# Patient Record
Sex: Female | Born: 1995
Health system: Southern US, Community
[De-identification: ages and names within clinical notes are randomized; demographics above are authoritative.]

## PROBLEM LIST (undated history)

## (undated) DIAGNOSIS — D649 Anemia, unspecified: Secondary | ICD-10-CM

## (undated) DIAGNOSIS — I10 Essential (primary) hypertension: Secondary | ICD-10-CM

## (undated) DIAGNOSIS — L0291 Cutaneous abscess, unspecified: Secondary | ICD-10-CM

## (undated) DIAGNOSIS — O139 Gestational [pregnancy-induced] hypertension without significant proteinuria, unspecified trimester: Secondary | ICD-10-CM

## (undated) DIAGNOSIS — B009 Herpesviral infection, unspecified: Secondary | ICD-10-CM

## (undated) DIAGNOSIS — M419 Scoliosis, unspecified: Secondary | ICD-10-CM

## (undated) HISTORY — DX: Scoliosis, unspecified: M41.9

## (undated) HISTORY — DX: Essential (primary) hypertension: I10

## (undated) HISTORY — DX: Herpesviral infection, unspecified: B00.9

## (undated) HISTORY — DX: Anemia, unspecified: D64.9

## (undated) HISTORY — PX: WISDOM TOOTH EXTRACTION: SHX21

---
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
## 2013-01-29 ENCOUNTER — Emergency Department (HOSPITAL_COMMUNITY)
Admission: EM | Admit: 2013-01-29 | Discharge: 2013-01-29 | Disposition: A | Discharge status: Home / Self Care | Payer: Medicaid Other | Attending: Emergency Medicine | Admitting: Emergency Medicine

## 2013-01-29 ENCOUNTER — Encounter (HOSPITAL_COMMUNITY): Payer: Self-pay | Admitting: Emergency Medicine

## 2013-01-29 DIAGNOSIS — R109 Unspecified abdominal pain: Secondary | ICD-10-CM | POA: Insufficient documentation

## 2013-01-29 DIAGNOSIS — R82998 Other abnormal findings in urine: Secondary | ICD-10-CM | POA: Insufficient documentation

## 2013-01-29 DIAGNOSIS — Z3202 Encounter for pregnancy test, result negative: Secondary | ICD-10-CM | POA: Insufficient documentation

## 2013-01-29 DIAGNOSIS — R3989 Other symptoms and signs involving the genitourinary system: Secondary | ICD-10-CM

## 2013-01-29 DIAGNOSIS — G8929 Other chronic pain: Secondary | ICD-10-CM | POA: Insufficient documentation

## 2013-01-29 LAB — URINALYSIS, ROUTINE W REFLEX MICROSCOPIC
Nitrite: NEGATIVE
Protein, ur: NEGATIVE mg/dL
Specific Gravity, Urine: 1.004 — ABNORMAL LOW (ref 1.005–1.030)
Urobilinogen, UA: 0.2 mg/dL (ref 0.0–1.0)

## 2013-01-29 LAB — POCT PREGNANCY, URINE: Preg Test, Ur: NEGATIVE

## 2013-01-29 LAB — URINE MICROSCOPIC-ADD ON

## 2013-01-29 NOTE — ED Provider Notes (Signed)
History     CSN: [id number]  Arrival date & time 01/29/13  2023   First MD Initiated Contact with Patient 01/29/13 2035      Chief Complaint  Patient presents with  . c/o black urine     (Consider location/radiation/quality/duration/timing/severity/associated sxs/prior treatment) The history is provided by the patient.   17 year old female urinated tonight noted that the urine was black. She states that she had just died her hair. When she gave a urine sample after arriving in the ED, and the urine was almost completely clear. She denies urinary urgency, frequency, tenesmus, or dysuria. She has chronic suprapubic pain which is unchanged. She denies any flank pain. She denies any nausea or vomiting. Denies fever or chills.  History reviewed. No pertinent past medical history.  History reviewed. No pertinent past surgical history.  History reviewed. No pertinent family history.  History  Substance Use Topics  . Smoking status: Never Smoker   . Smokeless tobacco: Not on file  . Alcohol Use: Yes    OB History   Grav Para Term Preterm Abortions TAB SAB Ect Mult Living                  Review of Systems  All other systems reviewed and are negative.    Allergies  Review of patient's allergies indicates no known allergies.  Home Medications  No current outpatient prescriptions on file.  BP 138/91  Pulse 83  Temp(Src) 98.5 F (36.9 C) (Oral)  Resp 18  SpO2 100%  LMP 12/27/2012  Physical Exam  Nursing note and vitals reviewed.  17 year old female, resting comfortably and in no acute distress. Vital signs are significant for borderline hypertension with blood pressure 138/91. Oxygen saturation is 100%, which is normal. Head is normocephalic and atraumatic. PERRLA, EOMI. Oropharynx is clear. Neck is nontender and supple without adenopathy or JVD. Back is nontender and there is no CVA tenderness. Lungs are clear without rales, wheezes, or rhonchi. Chest is  nontender. Heart has regular rate and rhythm without murmur. Abdomen is soft, flat, nontender without masses or hepatosplenomegaly and peristalsis is normoactive. Extremities have no cyanosis or edema, full range of motion is present. Skin is warm and dry without rash. Neurologic: Mental status is normal, cranial nerves are intact, there are no motor or sensory deficits.  ED Course  Procedures (including critical care time)  Results for orders placed during the hospital encounter of 01/29/13  URINALYSIS, ROUTINE W REFLEX MICROSCOPIC      Result Value Range   Color, Urine BROWN (*) YELLOW   APPearance CLEAR  CLEAR   Specific Gravity, Urine 1.004 (*) 1.005 - 1.030   pH 7.5  5.0 - 8.0   Glucose, UA NEGATIVE  NEGATIVE mg/dL   Hgb urine dipstick NEGATIVE  NEGATIVE   Bilirubin Urine NEGATIVE  NEGATIVE   Ketones, ur NEGATIVE  NEGATIVE mg/dL   Protein, ur NEGATIVE  NEGATIVE mg/dL   Urobilinogen, UA 0.2  0.0 - 1.0 mg/dL   Nitrite NEGATIVE  NEGATIVE   Leukocytes, UA TRACE (*) NEGATIVE  URINE MICROSCOPIC-ADD ON      Result Value Range   Squamous Epithelial / LPF FEW (*) RARE   WBC, UA 0-2  <3 WBC/hpf   RBC / HPF 0-2  <3 RBC/hpf   Bacteria, UA RARE  RARE  POCT PREGNANCY, URINE      Result Value Range   Preg Test, Ur NEGATIVE  NEGATIVE     1. Abnormal urine color  MDM  Discolored urine of uncertain cause. Urinalysis is been sent. I doubt that the discolored urine is related to her hair dyes since that would not be absorbed into the body to be excreted by the urine.  Urinalysis does show urine is a brown color. Etiology of this is unclear. However, there is no indication for further testing at this time. She is discharged with reassurance but told to return if new symptoms appear.  Dione Booze, MD 01/29/13 2251

## 2013-01-29 NOTE — ED Notes (Signed)
Pt states she dyed her hair black today. Pt states she went to the bathroom and noticed that her urine was black/brown in color. Pt denies pain upon urination. Pt denies n/v/d. Pt c/o abdominal pain that she states is constant and chronic. Pt denies taking medications other than birth control.

## 2013-01-29 NOTE — ED Notes (Signed)
Pt alert and mentating appropriately upon d/c. Pt given d/c teaching and follow up care instructions. NAD noted upon d/c. Pt verbalizes understanding and has no further questions upon d/c. Pt ambulatory upon d/c.

## 2013-04-21 ENCOUNTER — Emergency Department (INDEPENDENT_AMBULATORY_CARE_PROVIDER_SITE_OTHER)
Admission: EM | Admit: 2013-04-21 | Discharge: 2013-04-21 | Disposition: A | Discharge status: Home / Self Care | Payer: Medicaid Other | Source: Home / Self Care

## 2013-04-21 ENCOUNTER — Encounter (HOSPITAL_COMMUNITY): Payer: Self-pay

## 2013-04-21 DIAGNOSIS — L0390 Cellulitis, unspecified: Secondary | ICD-10-CM

## 2013-04-21 DIAGNOSIS — L0291 Cutaneous abscess, unspecified: Secondary | ICD-10-CM

## 2013-04-21 NOTE — ED Provider Notes (Signed)
Robyn Miller is a 17 y.o. female who presents to Urgent Care today for abscess right buttocks. Patient notes a painful abscess of her right buttocks just inferior to her vagina worsening yesterday and today. She tried to use an antibiotic on it which did not help. She denies any previous history of abscess and feels well otherwise. No fevers or chills. No vaginal discharge. She notes that she shaves this area, and thinks it may started from an ingrown hair. Feels well otherwise.    PMH reviewed. Healthy History  Substance Use Topics  . Smoking status: Never Smoker   . Smokeless tobacco: Not on file  . Alcohol Use: Yes   ROS as above Medications reviewed. No current facility-administered medications for this encounter.   No current outpatient prescriptions on file.    Exam:  BP 144/76  Pulse 75  Temp(Src) 97.6 F (36.4 C) (Oral)  Resp 16  SpO2 100% Gen: Well NAD Skin: Erythematous swelling of the right buttocks just inferior to the anus. Area of fluctuance palpated surrounded by a small area of induration. Tender to palpation.   Abscess I&D:  Consent obtained and timeout performed.  Skin overlying the abscess was cleaned with alcohol and 2 mL of 2% lidocaine with epinephrine were injected overlying the area of fluctuance.  A scalpel was used to cut down to the abscess and pus was expressed.  The wound was then turned into a triangle and a small section of skin removed.  The wound is then explored with a wooden Q-tip breaking up loculations, and further pus expressed.  The wound was then packed with about 2 inches of quarter inch packing material and a dressing was applied.   Assessment and Plan: 17 y.o. female with abscess likely from ingrown hair due to shaving the pubic area.  Abscess incised and drained, and packed.  Remove packing material in 2 or 3 days.  Followup as needed.     Rodolph Bong, MD 04/21/13 [phone]

## 2013-04-21 NOTE — ED Notes (Signed)
Patient c/o abscess near vaginal area

## 2013-05-18 ENCOUNTER — Emergency Department (INDEPENDENT_AMBULATORY_CARE_PROVIDER_SITE_OTHER)
Admission: EM | Admit: 2013-05-18 | Discharge: 2013-05-18 | Disposition: A | Discharge status: Home / Self Care | Payer: Medicaid Other | Source: Home / Self Care | Attending: Family Medicine | Admitting: Family Medicine

## 2013-05-18 ENCOUNTER — Encounter (HOSPITAL_COMMUNITY): Payer: Self-pay | Admitting: *Deleted

## 2013-05-18 DIAGNOSIS — L03317 Cellulitis of buttock: Secondary | ICD-10-CM

## 2013-05-18 HISTORY — DX: Cutaneous abscess, unspecified: L02.91

## 2013-05-18 MED ORDER — DOXYCYCLINE HYCLATE 100 MG PO CAPS: 100.0000 mg | ORAL_CAPSULE | Freq: Two times a day (BID) | ORAL | Status: DC

## 2013-05-18 NOTE — ED Notes (Signed)
Had I&D buttock abscess 7/20; states completely went away, but started to notice small bump to same area again last night.  No fevers or drainage.

## 2013-05-18 NOTE — ED Provider Notes (Signed)
  CSN: [id number]     Arrival date & time 05/18/13  0865 History     First MD Initiated Contact with Patient 05/18/13 1009     Chief Complaint  Patient presents with  . Abscess   (Consider location/radiation/quality/duration/timing/severity/associated sxs/prior Treatment) Patient is a 17 y.o. female presenting with abscess. The history is provided by the patient.  Abscess Location:  Ano-genital Ano-genital abscess location:  Perineum Abscess quality: fluctuance, induration, painful and redness   Abscess quality: not draining   Red streaking: no   Duration:  1 day Pain details:    Severity:  Mild   Progression:  Worsening Chronicity:  Recurrent Context comment:  Similar lesion resolved on 7/20   Past Medical History  Diagnosis Date  . Abscess    History reviewed. No pertinent past surgical history. No family history on file. History  Substance Use Topics  . Smoking status: Never Smoker   . Smokeless tobacco: Not on file  . Alcohol Use: Yes     Comment: occasional   OB History   Grav Para Term Preterm Abortions TAB SAB Ect Mult Living                 Review of Systems  Constitutional: Negative.   Genitourinary: Negative.   Skin: Positive for rash.    Allergies  Review of patient's allergies indicates no known allergies.  Home Medications   Current Outpatient Rx  Name  Route  Sig  Dispense  Refill  . doxycycline (VIBRAMYCIN) 100 MG capsule   Oral   Take 1 capsule (100 mg total) by mouth 2 (two) times daily.   20 capsule   0    BP 137/89  Pulse 72  Temp(Src) 98.1 F (36.7 C) (Oral)  Resp 16  SpO2 100%  LMP 04/21/2013 Physical Exam  Nursing note and vitals reviewed. Constitutional: She appears well-developed and well-nourished.  Skin: Skin is warm and dry. Rash noted. There is erythema.  Indurated area possible sl fluctuance to right perineum. Pt desires abx.    ED Course   Procedures (including critical care time)  Labs Reviewed - No data to  display No results found. 1. Cellulitis and abscess of buttock     MDM    Linna Hoff, MD 05/18/13 1025

## 2013-05-28 ENCOUNTER — Other Ambulatory Visit (HOSPITAL_COMMUNITY)
Admission: RE | Admit: 2013-05-28 | Discharge: 2013-05-28 | Disposition: A | Discharge status: Home / Self Care | Payer: Medicaid Other | Source: Ambulatory Visit | Attending: Family Medicine | Admitting: Family Medicine

## 2013-05-28 ENCOUNTER — Emergency Department (INDEPENDENT_AMBULATORY_CARE_PROVIDER_SITE_OTHER)
Admission: EM | Admit: 2013-05-28 | Discharge: 2013-05-28 | Disposition: A | Discharge status: Home / Self Care | Payer: Medicaid Other | Source: Home / Self Care

## 2013-05-28 ENCOUNTER — Encounter (HOSPITAL_COMMUNITY): Payer: Self-pay | Admitting: Emergency Medicine

## 2013-05-28 DIAGNOSIS — N760 Acute vaginitis: Secondary | ICD-10-CM | POA: Insufficient documentation

## 2013-05-28 DIAGNOSIS — Z113 Encounter for screening for infections with a predominantly sexual mode of transmission: Secondary | ICD-10-CM | POA: Insufficient documentation

## 2013-05-28 DIAGNOSIS — N898 Other specified noninflammatory disorders of vagina: Secondary | ICD-10-CM

## 2013-05-28 LAB — POCT URINALYSIS DIP (DEVICE)
Hgb urine dipstick: NEGATIVE
Ketones, ur: NEGATIVE mg/dL
Protein, ur: NEGATIVE mg/dL
Specific Gravity, Urine: 1.02 (ref 1.005–1.030)
Urobilinogen, UA: 0.2 mg/dL (ref 0.0–1.0)

## 2013-05-28 MED ORDER — FLUCONAZOLE 150 MG PO TABS: ORAL_TABLET | ORAL | Status: DC

## 2013-05-28 MED ORDER — METRONIDAZOLE 500 MG PO TABS: 500.0000 mg | ORAL_TABLET | Freq: Two times a day (BID) | ORAL | Status: DC

## 2013-05-28 NOTE — Discharge Instructions (Signed)
Vaginitis Vaginitis is an inflammation of the vagina. It is most often caused by a change in the normal balance of the bacteria and yeast that live in the vagina. This change in balance causes an overgrowth of certain bacteria or yeast, which causes the inflammation. There are different types of vaginitis, but the most common types are:  Bacterial vaginosis.  Yeast infection (candidiasis).  Trichomoniasis vaginitis. This is a sexually transmitted infection (STI).  Viral vaginitis.  Atropic vaginitis.  Allergic vaginitis. CAUSES  The cause depends on the type of vaginitis. Vaginitis can be caused by:  Bacteria (bacterial vaginosis).  Yeast (yeast infection).  A parasite (trichomoniasis vaginitis)  A virus (viral vaginitis).  Low hormone levels (atrophic vaginitis). Low hormone levels can occur during pregnancy, breastfeeding, or after menopause.  Irritants, such as bubble baths, scented tampons, and feminine sprays (allergic vaginitis). Other factors can change the normal balance of the yeast and bacteria that live in the vagina. These include:  Antibiotic medicines.  Poor hygiene.  Diaphragms, vaginal sponges, spermicides, birth control pills, and intrauterine devices (IUD).  Sexual intercourse.  Infection.  Uncontrolled diabetes.  A weakened immune system. SYMPTOMS  Symptoms can vary depending on the cause of the vaginitis. Common symptoms include:  Abnormal vaginal discharge.  The discharge is white, gray, or yellow with bacterial vaginosis.  The discharge is thick, white, and cheesy with a yeast infection.  The discharge is frothy and yellow or greenish with trichomoniasis.  A bad vaginal odor.  The odor is fishy with bacterial vaginosis.  Vaginal itching, pain, or swelling.  Painful intercourse.  Pain or burning when urinating. Sometimes, there are no symptoms. TREATMENT  Treatment will vary depending on the type of infection.   Bacterial  vaginosis and trichomoniasis are often treated with antibiotic creams or pills.  Yeast infections are often treated with antifungal medicines, such as vaginal creams or suppositories.  Viral vaginitis has no cure, but symptoms can be treated with medicines that relieve discomfort. Your sexual partner should be treated as well.  Atrophic vaginitis may be treated with an estrogen cream, pill, suppository, or vaginal ring. If vaginal dryness occurs, lubricants and moisturizing creams may help. You may be told to avoid scented soaps, sprays, or douches.  Allergic vaginitis treatment involves quitting the use of the product that is causing the problem. Vaginal creams can be used to treat the symptoms. HOME CARE INSTRUCTIONS   Take all medicines as directed by your caregiver.  Keep your genital area clean and dry. Avoid soap and only rinse the area with water.  Avoid douching. It can remove the healthy bacteria in the vagina.  Do not use tampons or have sexual intercourse until your vaginitis has been treated. Use sanitary pads while you have vaginitis.  Wipe from front to back. This avoids the spread of bacteria from the rectum to the vagina.  Let air reach your genital area.  Wear cotton underwear to decrease moisture buildup.  Avoid wearing underwear while you sleep until your vaginitis is gone.  Avoid tight pants and underwear or nylons without a cotton panel.  Take off wet clothing (especially bathing suits) as soon as possible.  Use mild, non-scented products. Avoid using irritants, such as:  Scented feminine sprays.  Fabric softeners.  Scented detergents.  Scented tampons.  Scented soaps or bubble baths.  Practice safe sex and use condoms. Condoms may prevent the spread of trichomoniasis and viral vaginitis. SEEK MEDICAL CARE IF:   You have abdominal pain.  You  have a fever or persistent symptoms for more than 2 3 days.  You have a fever and your symptoms suddenly  get worse. Document Released: 07/17/2007 Document Revised: 06/13/2012 Document Reviewed: 03/01/2012 [facility] Patient Information 2014 [place], [place].  General Instructions for Vaginal Infections Vaginitis is a term to describe many common vaginal infections. These infections may be due to an imbalance of normal germs (bacteria) that exist in the vagina. Many others are caused by sexually transmitted diseases (STDs). If any medicine was prescribed to treat your specific infection, it is very important that you take the medicine as directed. Your caregiver may want to examine and treat your sex partner. CAUSES  The vagina normally contains organisms (bacteria and yeast) in a balance. Certain factors can disturb this balance and cause an infection, such as:  Sexual intercourse.  Nursing.  Pregnancy.  Menopause.  Hormone changes in the body.  Antibiotic medicines.  Infection elsewhere in your body.  Birth control pills or patches.  Douches.  Spermicides.  Medical illnesses, such as diabetes. SYMPTOMS  Different types of vaginal infections cause symptoms such as:  Itching.  Pain or burning.  Bad odor.  Pain or bleeding with sexual intercourse.  Redness of the vulva.  Abnormal discharge (yellow, green, heavy white and thick).  Fever.  A sore on the vulva or vagina.  Urinary symptoms (painful or bloody urine).  Pelvic or abdominal pain.  Rectal bleeding, discharge, or pain. DIAGNOSIS   Your caregiver will base the diagnosis upon the symptoms that you report.  A complete history of your sex life may be taken.  You may have a pelvic exam.  A sample of your vaginal fluid or discharge will be examined under the microscope.  Cultures will help complete the exact diagnosis. TREATMENT  Treatment depends on the cause of your vaginitis. Your treatment may include taking antibiotics. The antibiotic may be a shot, a pill, or vaginal suppository or cream. It is not  uncommon for more than one type of infection to be present. If more than one infection is present, two or more medicines may be required. Reoccurrence of vaginal infections may be treated with vaginal suppositories or a vaginal cream 2 times a week, or as directed. If your caregiver finds that an STD exists, treatment of your sexual partner(s) is important. This is especially important for those infected with chlamydia, gonorrhea, trichomoniasis, bacterial vaginosis, syphilis, and HIV infections. Treating sexual partners will prevent you from being re-infected and will help stop the spread of STD infection to others. Although it is best to see a specialist for STD/HIV testing and counseling, this is not always possible. Some states/provinces permit something called "expedited partner therapy."This kind of program permits you to deliver prescription(s) to a partner without the partner having to seek a formal medical exam.  HOME CARE INSTRUCTIONS   Take all prescribed medicine.  If applicable, speak to your partner about recommended treatment.  Do not have sexual intercourse for 1 week, or as directed by your caregiver.  Practice safe sex.  Use condoms.  Have only 1 sex partner.  Make sure your sex partner does not have any other sex partners.  Avoid tight pants and panty hose.  Wear cotton underwear.  Do not douche.  Avoid tampons, especially scented ones.  Take warm sitz baths.  Avoid vaginal sprays, perfumed soaps, and bath oils.  Apply medicated cream (steroid cream) for itching or irritation with the permission of your caregiver. SEEK MEDICAL CARE IF:   You  have any kind of abnormal vaginal discharge.  Your sex partner has a genital infection.  You have pain or bleeding with sexual intercourse.  You have itching, pain, irritation or bleeding of the vulva. SEEK IMMEDIATE MEDICAL CARE IF:   You have an oral temperature above 102 F (38.9 C), not controlled by  medicine.  You have abdominal pain.  Your symptoms do not improve within 3 days or as directed.  You have painful or bloody urine.  You have rectal pain, bleeding, or discharge. Document Released: 06/29/2005 Document Revised: 12/12/2011 Document Reviewed: 02/12/2009 [facility] Patient Information 2013 [place], [place].  Bacterial Vaginosis Bacterial vaginosis (BV) is a vaginal infection where the normal balance of bacteria in the vagina is disrupted. The normal balance is then replaced by an overgrowth of certain bacteria. There are several different kinds of bacteria that can cause BV. BV is the most common vaginal infection in women of childbearing age. CAUSES   The cause of BV is not fully understood. BV develops when there is an increase or imbalance of harmful bacteria.  Some activities or behaviors can upset the normal balance of bacteria in the vagina and put women at increased risk including:  Having a new sex partner or multiple sex partners.  Douching.  Using an intrauterine device (IUD) for contraception.  It is not clear what role sexual activity plays in the development of BV. However, women that have never had sexual intercourse are rarely infected with BV. Women do not get BV from toilet seats, bedding, swimming pools or from touching objects around them.  SYMPTOMS   Grey vaginal discharge.  A fish-like odor with discharge, especially after sexual intercourse.  Itching or burning of the vagina and vulva.  Burning or pain with urination.  Some women have no signs or symptoms at all. DIAGNOSIS  Your caregiver must examine the vagina for signs of BV. Your caregiver will perform lab tests and look at the sample of vaginal fluid through a microscope. They will look for bacteria and abnormal cells (clue cells), a pH test higher than 4.5, and a positive amine test all associated with BV.  RISKS AND COMPLICATIONS   Pelvic inflammatory disease (PID).  Infections  following gynecology surgery.  Developing HIV.  Developing herpes virus. TREATMENT  Sometimes BV will clear up without treatment. However, all women with symptoms of BV should be treated to avoid complications, especially if gynecology surgery is planned. Female partners generally do not need to be treated. However, BV may spread between female sex partners so treatment is helpful in preventing a recurrence of BV.   BV may be treated with antibiotics. The antibiotics come in either pill or vaginal cream forms. Either can be used with nonpregnant or pregnant women, but the recommended dosages differ. These antibiotics are not harmful to the baby.  BV can recur after treatment. If this happens, a second round of antibiotics will often be prescribed.  Treatment is important for pregnant women. If not treated, BV can cause a premature delivery, especially for a pregnant woman who had a premature birth in the past. All pregnant women who have symptoms of BV should be checked and treated.  For chronic reoccurrence of BV, treatment with a type of prescribed gel vaginally twice a week is helpful. HOME CARE INSTRUCTIONS   Finish all medication as directed by your caregiver.  Do not have sex until treatment is completed.  Tell your sexual partner that you have a vaginal infection. They should see their  caregiver and be treated if they have problems, such as a mild rash or itching.  Practice safe sex. Use condoms. Only have 1 sex partner. PREVENTION  Basic prevention steps can help reduce the risk of upsetting the natural balance of bacteria in the vagina and developing BV:  Do not have sexual intercourse (be abstinent).  Do not douche.  Use all of the medicine prescribed for treatment of BV, even if the signs and symptoms go away.  Tell your sex partner if you have BV. That way, they can be treated, if needed, to prevent reoccurrence. SEEK MEDICAL CARE IF:   Your symptoms are not improving  after 3 days of treatment.  You have increased discharge, pain, or fever. MAKE SURE YOU:   Understand these instructions.  Will watch your condition.  Will get help right away if you are not doing well or get worse. FOR MORE INFORMATION  Division of STD Prevention (DSTDP), Centers for Disease Control and Prevention: [employer] American Social Health Association (ASHA): www.ashastd.org  Document Released: 09/19/2005 Document Revised: 12/12/2011 Document Reviewed: 03/12/2009 [facility] Patient Information 2014 [place], [place].

## 2013-05-28 NOTE — ED Provider Notes (Signed)
CSN: [id number]     Arrival date & time 05/28/13  1119 History   None    Chief Complaint  Patient presents with  . Rash   (Consider location/radiation/quality/duration/timing/severity/associated sxs/prior Treatment) HPI Comments: 17 year old female states that she had a boil in her buttocks apparently one month ago. She was treated with I&D and this abated. She later developed another smaller abscess that was treated with doxycycline and has since abated. The doxycycline his been making her feel bad and has precipitated 2 episodes of Candida vaginitis. She presents with a four-day history of vaginal discharge associated with a red base, tenderness and burning in the vulvovaginal tissues. There some improvement in the burning however she still has itching in a vaginal discharge. LMP was at the and July. Pregnancy test is negative. Denies pelvic pain or abdominal pain.   Past Medical History  Diagnosis Date  . Abscess    History reviewed. No pertinent past surgical history. History reviewed. No pertinent family history. History  Substance Use Topics  . Smoking status: Never Smoker   . Smokeless tobacco: Not on file  . Alcohol Use: Yes     Comment: occasional   OB History   Grav Para Term Preterm Abortions TAB SAB Ect Mult Living                 Review of Systems  Constitutional: Negative.   Respiratory: Negative.   Gastrointestinal: Negative.   Genitourinary: Positive for vaginal discharge. Negative for dysuria, urgency, frequency, flank pain, decreased urine volume, vaginal bleeding, genital sores, vaginal pain, menstrual problem and pelvic pain.  Skin: Negative.   Neurological: Negative.     Allergies  Review of patient's allergies indicates no known allergies.  Home Medications   Current Outpatient Rx  Name  Route  Sig  Dispense  Refill  . fluconazole (DIFLUCAN) 150 MG tablet      1 tab po x 1. May repeat in 72 hours if no improvement   2 tablet   0   .  metroNIDAZOLE (FLAGYL) 500 MG tablet   Oral   Take 1 tablet (500 mg total) by mouth 2 (two) times daily. X 7 days   14 tablet   0    BP 119/80  Temp(Src) 98.7 F (37.1 C) (Oral)  Resp 16  SpO2 100%  LMP 04/26/2013 Physical Exam  Nursing note and vitals reviewed. Constitutional: She is oriented to person, place, and time. She appears well-developed and well-nourished. She appears distressed.  Neck: Normal range of motion. Neck supple.  Cardiovascular: Normal rate.   Pulmonary/Chest: Effort normal. No respiratory distress.  Abdominal: Soft. She exhibits no distension and no mass. There is no tenderness. There is no rebound and no guarding.  Genitourinary: Vaginal discharge found.  Normal external female genitalia Minor erythema and scant dried discharge to be in any minora and posterior fornix. Vagina with scant vaginal discharge coating the walls and a small amount surrounding the cervix. This is a thick or flight gray color. The cervix is anterior in midline. Os normal parous, the ectocervix has erythema and inflammation. Bimanual: No CMT or adnexal tenderness.  Musculoskeletal: Normal range of motion. She exhibits no edema.  Neurological: She is alert and oriented to person, place, and time. She exhibits normal muscle tone.  Skin: Skin is warm and dry. No rash noted. No erythema.  Psychiatric: She has a normal mood and affect.    ED Course  Procedures (including critical care time) Labs Review Labs Reviewed  POCT URINALYSIS DIP (DEVICE)  POCT PREGNANCY, URINE  CERVICOVAGINAL ANCILLARY ONLY   Imaging Review No results found.  MDM   1. Vaginitis   2. Vaginal discharge    Suspect the patient may have a combination of ED and Candida vaginitis. The ancillary swabs were obtained and results are pending. She is treated with Flagyl 500 mg twice a day for 7 days and Diflucan one tab now may repeat in 72 hours if needed.   Hayden Rasmussen, NP 05/28/13 1234

## 2013-05-28 NOTE — ED Notes (Signed)
C/O vaginal rash with itching and discharge. Pt states she was taking doxycycline for a boil and believes she has a yeast infection. Pt was given a prescription for a yeast infection as a precautionary with the doxycycline. Pt took the medication Saturday with no relief.

## 2013-05-30 NOTE — ED Provider Notes (Signed)
Medical screening examination/treatment/procedure(s) were performed by resident physician or non-physician practitioner and as supervising physician I was immediately available for consultation/collaboration.   Barkley Bruns MD.   Linna Hoff, MD 05/30/13 [phone]

## 2013-07-18 ENCOUNTER — Emergency Department (HOSPITAL_COMMUNITY)
Admission: EM | Admit: 2013-07-18 | Discharge: 2013-07-18 | Disposition: A | Discharge status: Home / Self Care | Payer: Medicaid Other | Attending: Emergency Medicine | Admitting: Emergency Medicine

## 2013-07-18 DIAGNOSIS — T550X1A Toxic effect of soaps, accidental (unintentional), initial encounter: Secondary | ICD-10-CM | POA: Insufficient documentation

## 2013-07-18 DIAGNOSIS — T7840XA Allergy, unspecified, initial encounter: Secondary | ICD-10-CM

## 2013-07-18 DIAGNOSIS — Z79899 Other long term (current) drug therapy: Secondary | ICD-10-CM | POA: Insufficient documentation

## 2013-07-18 DIAGNOSIS — L509 Urticaria, unspecified: Secondary | ICD-10-CM | POA: Insufficient documentation

## 2013-07-18 DIAGNOSIS — Z792 Long term (current) use of antibiotics: Secondary | ICD-10-CM | POA: Insufficient documentation

## 2013-07-18 DIAGNOSIS — Y939 Activity, unspecified: Secondary | ICD-10-CM | POA: Insufficient documentation

## 2013-07-18 DIAGNOSIS — T65891A Toxic effect of other specified substances, accidental (unintentional), initial encounter: Secondary | ICD-10-CM | POA: Insufficient documentation

## 2013-07-18 DIAGNOSIS — Y929 Unspecified place or not applicable: Secondary | ICD-10-CM | POA: Insufficient documentation

## 2013-07-18 MED ORDER — PREDNISONE 20 MG PO TABS: 40.0000 mg | ORAL_TABLET | Freq: Every day | ORAL | Status: DC

## 2013-07-18 MED ORDER — PREDNISONE 20 MG PO TABS
40.0000 mg | ORAL_TABLET | Freq: Once | ORAL | Status: AC
  Administered 2013-07-18: 40 mg via ORAL
  Filled 2013-07-18: qty 2

## 2013-07-18 NOTE — ED Provider Notes (Signed)
CSN: [id number]     Arrival date & time 07/18/13  2053 History   This chart was scribed for non-physician practitioner working with Vida Roller, MD by Clydene Laming, ED Scribe. This patient was seen in room TR11C/TR11C and the patient's care was started at 10:12 PM.    Chief Complaint  Patient presents with  . Allergic Reaction    The history is provided by the patient. No language interpreter was used.   HPI Comments: Robyn Miller is a 17 y.o. female who presents to the Emergency Department complaining of an allergic reaction to the arms, upper shoulders, and neck with an associated itch onset two weeks ago. Pt states reporting to the urgent care and was prescribed triamcinolone cream without relief. Pt reports she recently changed her laundry detergent to gain along with using a new baby oil. Pt denies being at risk of bed bugs.   Past Medical History  Diagnosis Date  . Abscess    No past surgical history on file. No family history on file. History  Substance Use Topics  . Smoking status: Never Smoker   . Smokeless tobacco: Not on file  . Alcohol Use: Yes     Comment: occasional   OB History   Grav Para Term Preterm Abortions TAB SAB Ect Mult Living                 Review of Systems  Skin: Positive for color change.  All other systems reviewed and are negative.    Allergies  Review of patient's allergies indicates no known allergies.  Home Medications   Current Outpatient Rx  Name  Route  Sig  Dispense  Refill  . amoxicillin (AMOXIL) 500 MG capsule   Oral   Take 500 mg by mouth 3 (three) times daily.         . hydrOXYzine (ATARAX/VISTARIL) 25 MG tablet   Oral   Take 25 mg by mouth every 8 (eight) hours as needed for itching.         . triamcinolone cream (KENALOG) 0.1 %   Topical   Apply topically 3 (three) times daily. X 5 days          Triage Vitals: BP 143/81  Pulse 82  Temp(Src) 98.2 F (36.8 C) (Oral)  Resp 18  SpO2 100% Physical Exam   Nursing note and vitals reviewed. Constitutional: She is oriented to person, place, and time. She appears well-developed and well-nourished. No distress.  HENT:  Head: Normocephalic and atraumatic.  Eyes: EOM are normal.  Neck: Neck supple. No tracheal deviation present.  Cardiovascular: Normal rate.   Pulmonary/Chest: Effort normal. No respiratory distress.  Musculoskeletal: Normal range of motion.  Neurological: She is alert and oriented to person, place, and time.  Skin: Skin is warm and dry.  Urticarial lesions on bilateral arms and upper back Pt scratching throughout interview   Psychiatric: She has a normal mood and affect. Her behavior is normal.    ED Course  Procedures (including critical care time) DIAGNOSTIC STUDIES: Oxygen Saturation is 100% on RA, normal by my interpretation.    COORDINATION OF CARE: 10:16 PM- Discussed treatment plan with pt at bedside. Pt verbalized understanding and agreement with plan.   Labs Review Labs Reviewed - No data to display Imaging Review No results found.  EKG Interpretation   None       MDM   1. Allergic reaction, initial encounter     11:41 PM Patient likely having allergic  reaction. I will treat her with Prednisone, as her symptoms have been persistent for the past 2 weeks. Vitals stable and patient afebrile.   I personally performed the services described in this documentation, which was scribed in my presence. The recorded information has been reviewed and is accurate.     Emilia Beck, PA-C 07/18/13 2342

## 2013-07-18 NOTE — ED Notes (Signed)
Skin irritation/allergic reaction for x 2 weeks. ucc prescribe hydroxyzine and triamcinolone ointment with no improvement. Pt. Changed laundry detergent to GAIN and baby oil.  Redness on arms, upper shoulders, and neck.

## 2013-07-19 NOTE — ED Provider Notes (Signed)
Medical screening examination/treatment/procedure(s) were performed by non-physician practitioner and as supervising physician I was immediately available for consultation/collaboration.    Vida Roller, MD 07/19/13 [phone]

## 2013-07-30 ENCOUNTER — Encounter (HOSPITAL_COMMUNITY): Payer: Self-pay | Admitting: Emergency Medicine

## 2013-07-30 ENCOUNTER — Emergency Department (INDEPENDENT_AMBULATORY_CARE_PROVIDER_SITE_OTHER)
Admission: EM | Admit: 2013-07-30 | Discharge: 2013-07-30 | Disposition: A | Discharge status: Home / Self Care | Payer: Medicaid Other | Source: Home / Self Care | Attending: Family Medicine | Admitting: Family Medicine

## 2013-07-30 DIAGNOSIS — A6000 Herpesviral infection of urogenital system, unspecified: Secondary | ICD-10-CM

## 2013-07-30 MED ORDER — VALACYCLOVIR HCL 1 G PO TABS: 1000.0000 mg | ORAL_TABLET | Freq: Two times a day (BID) | ORAL | Status: AC

## 2013-07-30 NOTE — ED Provider Notes (Signed)
Robyn Miller is a 17 y.o. female who presents to Urgent Care today for patient notes pain and irritation at the superior portion of her vagina. This is been present for about 3 days. It is associated with burning and tingling. She denies any vaginal discharge. She denies any injury. She feels well otherwise. She had a negative STD screen about 2 months ago.   Past Medical History  Diagnosis Date  . Abscess    History  Substance Use Topics  . Smoking status: Never Smoker   . Smokeless tobacco: Not on file  . Alcohol Use: Yes     Comment: occasional   ROS as above Medications reviewed. No current facility-administered medications for this encounter.   Current Outpatient Prescriptions  Medication Sig Dispense Refill  . valACYclovir (VALTREX) 1000 MG tablet Take 1 tablet (1,000 mg total) by mouth 2 (two) times daily.  20 tablet  1    Exam:  BP 146/87  Pulse 62  Temp(Src) 98.5 F (36.9 C) (Oral)  Resp 16  SpO2 99% Gen: Well NAD GENITAL: External genitalia. Multiple vesicles ulcerations present in the 12 o'clock position of the vagina near the clitoris. Consistent with herpes  No results found for this or any previous visit (from the past 24 hour(s)). No results found.  Assessment and Plan: 17 y.o. female with probable genital herpes. Neuro culture pending. Plan to treat empirically with Valtrex. We'll followup when asymptomatic with [facility] or health Department for STD screening. Discussed warning signs or symptoms. Please see discharge instructions. Patient expresses understanding.      Rodolph Bong, MD 07/30/13 2153

## 2013-07-30 NOTE — ED Notes (Signed)
Patient is concerned for vaginal tear/pain, noticed 3 days ago.

## 2013-08-01 LAB — HERPES SIMPLEX VIRUS CULTURE

## 2013-08-02 NOTE — ED Notes (Signed)
Lab review

## 2013-08-03 ENCOUNTER — Telehealth (HOSPITAL_COMMUNITY): Payer: Self-pay

## 2013-08-03 NOTE — ED Notes (Signed)
Lab results reported positive Herpes Simplex Type 2.  Patient made aware.  She was given RX for Valtrex at time of visit.  Patient had all additional questions addressed.

## 2013-09-24 ENCOUNTER — Emergency Department (INDEPENDENT_AMBULATORY_CARE_PROVIDER_SITE_OTHER)
Admission: EM | Admit: 2013-09-24 | Discharge: 2013-09-24 | Disposition: A | Discharge status: Home / Self Care | Payer: Medicaid Other | Source: Home / Self Care | Attending: Family Medicine | Admitting: Family Medicine

## 2013-09-24 ENCOUNTER — Encounter (HOSPITAL_COMMUNITY): Payer: Self-pay | Admitting: Emergency Medicine

## 2013-09-24 DIAGNOSIS — W57XXXA Bitten or stung by nonvenomous insect and other nonvenomous arthropods, initial encounter: Secondary | ICD-10-CM

## 2013-09-24 DIAGNOSIS — T148 Other injury of unspecified body region: Secondary | ICD-10-CM

## 2013-09-24 MED ORDER — PERMETHRIN 5 % EX CREA: TOPICAL_CREAM | CUTANEOUS | Status: DC

## 2013-09-24 MED ORDER — HYDROXYZINE HCL 25 MG PO TABS: 25.0000 mg | ORAL_TABLET | Freq: Four times a day (QID) | ORAL | Status: DC

## 2013-09-24 NOTE — ED Notes (Signed)
C/o rash all over that comes and goes. Pt has tried new detergent but states symptoms were present before change of detergent.  No relief with otc meds.  Denies any start of new medication, soaps and perfumes.

## 2013-09-24 NOTE — ED Provider Notes (Signed)
CSN: [id number]     Arrival date & time 09/24/13  1344 History   First MD Initiated Contact with Patient 09/24/13 1554     Chief Complaint  Patient presents with  . Urticaria   (Consider location/radiation/quality/duration/timing/severity/associated sxs/prior Treatment) Patient is a 17 y.o. female presenting with urticaria. The history is provided by the patient.  Urticaria This is a recurrent problem. The current episode started more than 1 week ago. The problem has been gradually worsening. Associated symptoms comments: Roommate with similar sx, , feels like related to another roommate with dog that had fleas..    Past Medical History  Diagnosis Date  . Abscess    History reviewed. No pertinent past surgical history. History reviewed. No pertinent family history. History  Substance Use Topics  . Smoking status: Never Smoker   . Smokeless tobacco: Not on file  . Alcohol Use: Yes     Comment: occasional   OB History   Grav Para Term Preterm Abortions TAB SAB Ect Mult Living                 Review of Systems  Constitutional: Negative.   Skin: Positive for rash.    Allergies  Review of patient's allergies indicates no known allergies.  Home Medications   Current Outpatient Rx  Name  Route  Sig  Dispense  Refill  . hydrOXYzine (ATARAX/VISTARIL) 25 MG tablet   Oral   Take 1 tablet (25 mg total) by mouth every 6 (six) hours. For itching.   20 tablet   0   . permethrin (ELIMITE) 5 % cream      Apply over body except head, tonight, wash off in am,   60 g   1    BP 136/92  Pulse 81  Temp(Src) 97.8 F (36.6 C) (Oral)  Resp 16  SpO2 100%  LMP 08/14/2013 Physical Exam  Nursing note and vitals reviewed. Constitutional: She is oriented to person, place, and time. She appears well-developed and well-nourished.  Neurological: She is alert and oriented to person, place, and time.  Skin: Skin is warm and dry. Rash noted.  Pruritic papular rash scattered over ext.     ED Course  Procedures (including critical care time) Labs Review Labs Reviewed - No data to display Imaging Review No results found.  EKG Interpretation    Date/Time:    Ventricular Rate:    PR Interval:    QRS Duration:   QT Interval:    QTC Calculation:   R Axis:     Text Interpretation:              MDM      Linna Hoff, MD 09/24/13 [phone]

## 2013-11-17 ENCOUNTER — Encounter (HOSPITAL_COMMUNITY): Payer: Self-pay | Admitting: Emergency Medicine

## 2013-11-17 ENCOUNTER — Emergency Department (INDEPENDENT_AMBULATORY_CARE_PROVIDER_SITE_OTHER)
Admission: EM | Admit: 2013-11-17 | Discharge: 2013-11-17 | Disposition: A | Discharge status: Home / Self Care | Payer: Medicaid Other | Source: Home / Self Care

## 2013-11-17 DIAGNOSIS — L03119 Cellulitis of unspecified part of limb: Secondary | ICD-10-CM

## 2013-11-17 DIAGNOSIS — L02416 Cutaneous abscess of left lower limb: Secondary | ICD-10-CM

## 2013-11-17 DIAGNOSIS — L02419 Cutaneous abscess of limb, unspecified: Secondary | ICD-10-CM

## 2013-11-17 MED ORDER — SULFAMETHOXAZOLE-TRIMETHOPRIM 800-160 MG PO TABS: 1.0000 | ORAL_TABLET | Freq: Two times a day (BID) | ORAL | Status: AC

## 2013-11-17 MED ORDER — HYDROCODONE-ACETAMINOPHEN 5-325 MG PO TABS: 1.0000 | ORAL_TABLET | ORAL | Status: DC | PRN

## 2013-11-17 NOTE — Discharge Instructions (Signed)
Abscess An abscess is an infected area that contains a collection of pus and debris.It can occur in almost any part of the body. An abscess is also known as a furuncle or boil. CAUSES  An abscess occurs when tissue gets infected. This can occur from blockage of oil or sweat glands, infection of hair follicles, or a minor injury to the skin. As the body tries to fight the infection, pus collects in the area and creates pressure under the skin. This pressure causes pain. People with weakened immune systems have difficulty fighting infections and get certain abscesses more often.  SYMPTOMS Usually an abscess develops on the skin and becomes a painful mass that is red, warm, and tender. If the abscess forms under the skin, you may feel a moveable soft area under the skin. Some abscesses break open (rupture) on their own, but most will continue to get worse without care. The infection can spread deeper into the body and eventually into the bloodstream, causing you to feel ill.  DIAGNOSIS  Your caregiver will take your medical history and perform a physical exam. A sample of fluid may also be taken from the abscess to determine what is causing your infection. TREATMENT  Your caregiver may prescribe antibiotic medicines to fight the infection. However, taking antibiotics alone usually does not cure an abscess. Your caregiver may need to make a small cut (incision) in the abscess to drain the pus. In some cases, gauze is packed into the abscess to reduce pain and to continue draining the area. HOME CARE INSTRUCTIONS   Only take over-the-counter or prescription medicines for pain, discomfort, or fever as directed by your caregiver.  If you were prescribed antibiotics, take them as directed. Finish them even if you start to feel better.  If gauze is used, follow your caregiver's directions for changing the gauze.  To avoid spreading the infection:  Keep your draining abscess covered with a  bandage.  Wash your hands well.  Do not share personal care items, towels, or whirlpools with others.  Avoid skin contact with others.  Keep your skin and clothes clean around the abscess.  Keep all follow-up appointments as directed by your caregiver. SEEK MEDICAL CARE IF:   You have increased pain, swelling, redness, fluid drainage, or bleeding.  You have muscle aches, chills, or a general ill feeling.  You have a fever. MAKE SURE YOU:   Understand these instructions.  Will watch your condition.  Will get help right away if you are not doing well or get worse. Document Released: 06/29/2005 Document Revised: 03/20/2012 Document Reviewed: 12/02/2011 Exit[employer] Patient Information 2014 [employer].  

## 2013-11-17 NOTE — ED Notes (Signed)
Pt  Has   A  Red  swollen  Tender  Area  To  Inner  Thigh  Tender to  Touch       Symptoms  X  sev  Days

## 2013-11-17 NOTE — ED Provider Notes (Signed)
CSN: [id number]     Arrival date & time 11/17/13  1417 History   None    Chief Complaint  Patient presents with  . Abscess     (Consider location/radiation/quality/duration/timing/severity/associated sxs/prior Treatment) HPI Comments: C/O hard, red, painful bump to L inner proximal thigh x 2 d.   Past Medical History  Diagnosis Date  . Abscess    History reviewed. No pertinent past surgical history. History reviewed. No pertinent family history. History  Substance Use Topics  . Smoking status: Never Smoker   . Smokeless tobacco: Not on file  . Alcohol Use: Yes     Comment: occasional   OB History   Grav Para Term Preterm Abortions TAB SAB Ect Mult Living                 Review of Systems  Skin:       As per HPI  All other systems reviewed and are negative.      Allergies  Review of patient's allergies indicates no known allergies.  Home Medications   Current Outpatient Rx  Name  Route  Sig  Dispense  Refill  . HYDROcodone-acetaminophen (NORCO/VICODIN) 5-325 MG per tablet   Oral   Take 1 tablet by mouth every 4 (four) hours as needed.   15 tablet   0   . hydrOXYzine (ATARAX/VISTARIL) 25 MG tablet   Oral   Take 1 tablet (25 mg total) by mouth every 6 (six) hours. For itching.   20 tablet   0   . permethrin (ELIMITE) 5 % cream      Apply over body except head, tonight, wash off in am,   60 g   1   . sulfamethoxazole-trimethoprim (BACTRIM DS,SEPTRA DS) 800-160 MG per tablet   Oral   Take 1 tablet by mouth 2 (two) times daily.   14 tablet   0    BP 151/79  Pulse 84  Temp(Src) 98.1 F (36.7 C) (Oral)  Resp 18  SpO2 100%  LMP 11/05/2013 Physical Exam  Nursing note and vitals reviewed. Constitutional: She is oriented to person, place, and time. She appears well-developed and well-nourished. No distress.  Pulmonary/Chest: Effort normal. No respiratory distress.  Musculoskeletal: Normal range of motion. She exhibits no edema and no tenderness.   Neurological: She is alert and oriented to person, place, and time. She exhibits normal muscle tone.  Skin: Skin is warm and dry.  6 cm diameter area of induration and mild erythema to left medial proximal thigh. Tender, no current drainage.  Psychiatric: She has a normal mood and affect.    ED Course  INCISION AND DRAINAGE Date/Time: 11/17/2013 3:50 PM Performed by: Phineas RealMABE, Joffrey Kerce Authorized by: Phineas RealMABE, Connelly [place] Consent: Verbal consent obtained. Risks and benefits: risks, benefits and alternatives were discussed Consent given by: patient Patient understanding: patient states understanding of the procedure being performed Patient identity confirmed: verbally with patient Type: abscess Body area: lower extremity Location details: left leg Anesthesia: local infiltration Local anesthetic: lidocaine 2% with epinephrine Anesthetic total: 7 ml Scalpel size: 11 Incision type: single with marsupialization Complexity: complex Drainage: purulent and  bloody Drainage amount: moderate Wound treatment: drain placed Packing material: 1/4 in gauze   (including critical care time) Labs Review Labs Reviewed - No data to display Imaging Review No results found.    MDM   Final diagnoses:  Abscess of leg, left    Warm compresses Keep area dry norco 5 mg for pain #15 Septra DS BID RTO 2  d for wound check.      Hayden Rasmussen, NP 11/17/13 1515

## 2013-11-17 NOTE — ED Provider Notes (Signed)
Medical screening examination/treatment/procedure(s) were performed by a resident physician or non-physician practitioner and as the supervising physician I was immediately available for consultation/collaboration.  Elizibeth Breau, MD   Journey Castonguay S Olukemi Panchal, MD 11/17/13 1941 

## 2013-11-19 ENCOUNTER — Encounter (HOSPITAL_COMMUNITY): Payer: Self-pay | Admitting: Emergency Medicine

## 2013-11-19 ENCOUNTER — Emergency Department (INDEPENDENT_AMBULATORY_CARE_PROVIDER_SITE_OTHER)
Admission: EM | Admit: 2013-11-19 | Discharge: 2013-11-19 | Disposition: A | Discharge status: Home / Self Care | Payer: Medicaid Other | Source: Home / Self Care | Attending: Family Medicine | Admitting: Family Medicine

## 2013-11-19 DIAGNOSIS — Z4800 Encounter for change or removal of nonsurgical wound dressing: Secondary | ICD-10-CM

## 2013-11-19 DIAGNOSIS — Z5189 Encounter for other specified aftercare: Secondary | ICD-10-CM

## 2013-11-19 NOTE — Discharge Instructions (Signed)
Finish antibiotic, continue local washing and triple antibiotic ointment as needed.return if any further problems. °

## 2013-11-19 NOTE — ED Notes (Signed)
Pt is here for a f/u and to have packing removed from left inner thigh Taking antibiotics and tolerating well; has stopped taking hydrocodone due to s/e Voices no new concerns Alert w/no signs of acute distress.

## 2013-11-19 NOTE — ED Provider Notes (Signed)
CSN: [id number]     Arrival date & time 11/19/13  1224 History   First MD Initiated Contact with Patient 11/19/13 1241     Chief Complaint  Patient presents with  . Follow-up    abscess      (Consider location/radiation/quality/duration/timing/severity/associated sxs/prior Treatment) Patient is a 18 y.o. female presenting with wound check. The history is provided by the patient.  Wound Check This is a new problem. The current episode started 2 days ago. The problem has been gradually improving. Associated symptoms comments: Sx improving, not needing hydrocodone..    Past Medical History  Diagnosis Date  . Abscess    History reviewed. No pertinent past surgical history. No family history on file. History  Substance Use Topics  . Smoking status: Never Smoker   . Smokeless tobacco: Not on file  . Alcohol Use: Yes     Comment: occasional   OB History   Grav Para Term Preterm Abortions TAB SAB Ect Mult Living                 Review of Systems  Constitutional: Negative.   Musculoskeletal: Negative.   Skin: Positive for wound.      Allergies  Review of patient's allergies indicates no known allergies.  Home Medications   Current Outpatient Rx  Name  Route  Sig  Dispense  Refill  . HYDROcodone-acetaminophen (NORCO/VICODIN) 5-325 MG per tablet   Oral   Take 1 tablet by mouth every 4 (four) hours as needed.   15 tablet   0   . sulfamethoxazole-trimethoprim (BACTRIM DS,SEPTRA DS) 800-160 MG per tablet   Oral   Take 1 tablet by mouth 2 (two) times daily.   14 tablet   0   . hydrOXYzine (ATARAX/VISTARIL) 25 MG tablet   Oral   Take 1 tablet (25 mg total) by mouth every 6 (six) hours. For itching.   20 tablet   0   . permethrin (ELIMITE) 5 % cream      Apply over body except head, tonight, wash off in am,   60 g   1    BP 132/87  Pulse 92  Temp(Src) 97.7 F (36.5 C) (Oral)  LMP 11/05/2013 Physical Exam  Nursing note and vitals  reviewed. Constitutional: She is oriented to person, place, and time. She appears well-developed and well-nourished.  Neurological: She is alert and oriented to person, place, and time.  Skin: Skin is warm and dry.  Packing removed from left thigh, min drainage, sl tender, dsd applied.    ED Course  Procedures (including critical care time) Labs Review Labs Reviewed - No data to display Imaging Review No results found.    MDM   Final diagnoses:  Encounter for wound re-check       Linna HoffJames D Embree Brawley, MD 11/19/13 1309

## 2013-11-21 LAB — CULTURE, ROUTINE-ABSCESS: Special Requests: NORMAL

## 2013-11-22 ENCOUNTER — Telehealth (HOSPITAL_COMMUNITY): Payer: Self-pay | Admitting: *Deleted

## 2013-11-22 NOTE — ED Notes (Signed)
Abscess culture L thigh: Mod. MRSA.  Pt. adequately treated with Bactrim DS. I called pt. Pt. verified x 2 and given result.  Pt. told she is adequately treated with Bactrim DS.  [facility] MRSA [facility] with her. Pt. voiced understanding. Pt. asked where she got it.  I told her I do not know.  She knew someone with MRSA but has not seen them for 1 yr. Vassie MoselleYork, Robyn Miller 11/22/2013

## 2015-09-09 ENCOUNTER — Encounter (HOSPITAL_COMMUNITY): Payer: Self-pay | Admitting: Emergency Medicine

## 2015-09-09 ENCOUNTER — Emergency Department (HOSPITAL_COMMUNITY): Payer: Medicaid Other

## 2015-09-09 ENCOUNTER — Inpatient Hospital Stay (HOSPITAL_COMMUNITY)
Admission: EM | Admit: 2015-09-09 | Discharge: 2015-09-10 | Disposition: A | Discharge status: Home / Self Care | Payer: Medicaid Other | Attending: Obstetrics & Gynecology | Admitting: Obstetrics & Gynecology

## 2015-09-09 DIAGNOSIS — N939 Abnormal uterine and vaginal bleeding, unspecified: Secondary | ICD-10-CM | POA: Insufficient documentation

## 2015-09-09 DIAGNOSIS — Z3A14 14 weeks gestation of pregnancy: Secondary | ICD-10-CM | POA: Diagnosis not present

## 2015-09-09 DIAGNOSIS — O99011 Anemia complicating pregnancy, first trimester: Secondary | ICD-10-CM | POA: Diagnosis not present

## 2015-09-09 DIAGNOSIS — D649 Anemia, unspecified: Secondary | ICD-10-CM | POA: Diagnosis not present

## 2015-09-09 DIAGNOSIS — R58 Hemorrhage, not elsewhere classified: Secondary | ICD-10-CM

## 2015-09-09 DIAGNOSIS — O039 Complete or unspecified spontaneous abortion without complication: Secondary | ICD-10-CM | POA: Insufficient documentation

## 2015-09-09 DIAGNOSIS — O021 Missed abortion: Secondary | ICD-10-CM | POA: Diagnosis not present

## 2015-09-09 LAB — CBC WITH DIFFERENTIAL/PLATELET
Basophils Absolute: 0 10*3/uL (ref 0.0–0.1)
Basophils Relative: 0 %
EOS ABS: 0 10*3/uL (ref 0.0–0.7)
Eosinophils Relative: 0 %
HEMATOCRIT: 34.7 % — AB (ref 36.0–46.0)
HEMOGLOBIN: 11.4 g/dL — AB (ref 12.0–15.0)
LYMPHS ABS: 2.6 10*3/uL (ref 0.7–4.0)
LYMPHS PCT: 28 %
MCH: 27.3 pg (ref 26.0–34.0)
MCHC: 32.9 g/dL (ref 30.0–36.0)
MCV: 83.2 fL (ref 78.0–100.0)
Monocytes Absolute: 0.4 10*3/uL (ref 0.1–1.0)
Monocytes Relative: 4 %
NEUTROS ABS: 6.3 10*3/uL (ref 1.7–7.7)
NEUTROS PCT: 68 %
Platelets: 291 10*3/uL (ref 150–400)
RBC: 4.17 MIL/uL (ref 3.87–5.11)
RDW: 13.4 % (ref 11.5–15.5)
WBC: 9.3 10*3/uL (ref 4.0–10.5)

## 2015-09-09 LAB — I-STAT CHEM 8, ED
BUN: 9 mg/dL (ref 6–20)
CHLORIDE: 104 mmol/L (ref 101–111)
CREATININE: 0.7 mg/dL (ref 0.44–1.00)
Calcium, Ion: 1.1 mmol/L — ABNORMAL LOW (ref 1.12–1.23)
GLUCOSE: 81 mg/dL (ref 65–99)
HEMATOCRIT: 40 % (ref 36.0–46.0)
Hemoglobin: 13.6 g/dL (ref 12.0–15.0)
POTASSIUM: 3.4 mmol/L — AB (ref 3.5–5.1)
Sodium: 140 mmol/L (ref 135–145)
TCO2: 24 mmol/L (ref 0–100)

## 2015-09-09 LAB — ABO/RH: ABO/RH(D): A POS

## 2015-09-09 LAB — HEMOGLOBIN AND HEMATOCRIT, BLOOD
HCT: 29.8 % — ABNORMAL LOW (ref 36.0–46.0)
Hemoglobin: 9.8 g/dL — ABNORMAL LOW (ref 12.0–15.0)

## 2015-09-09 LAB — HCG, QUANTITATIVE, PREGNANCY: hCG, Beta Chain, Quant, S: 19226 m[IU]/mL — ABNORMAL HIGH (ref <5)

## 2015-09-09 MED ORDER — SODIUM CHLORIDE 0.9 % IV BOLUS (SEPSIS)
1000.0000 mL | Freq: Once | INTRAVENOUS | Status: AC
  Administered 2015-09-09: 1000 mL via INTRAVENOUS

## 2015-09-09 MED ORDER — MISOPROSTOL 200 MCG PO TABS
800.0000 ug | ORAL_TABLET | Freq: Once | ORAL | Status: AC
  Administered 2015-09-09: 800 ug via ORAL
  Filled 2015-09-09: qty 4

## 2015-09-09 NOTE — ED Notes (Signed)
Pt reports vaginal bleeding started 1 hour ago, had positive preg test last week. Last period was on September per pt. Pt presents with large obvious bleeding . Also reports abd cramps. Pt is alert and oriented x 4.

## 2015-09-09 NOTE — ED Provider Notes (Signed)
Pt was seen in conjunction with PA Kirichenko.  Pt is GIPO with acute vaginal bleeding.  Pt has completed her workup.  Plan was to give her misoprostol and monitor in the ED per Dr Forestine ChuteEure's recommendations.  While getting blood drawn she became pale and BP dropped.  I repeated her pelvic exam as documented below  Physical Exam  BP 137/83 mmHg  Pulse 88  Temp(Src) 98.7 F (37.1 C) (Oral)  Resp 14  SpO2 100%  LMP 07/06/2015  Physical Exam  Constitutional: She appears well-developed and well-nourished. No distress.  HENT:  Head: Normocephalic and atraumatic.  Right Ear: External ear normal.  Left Ear: External ear normal.  Eyes: Conjunctivae are normal. Right eye exhibits no discharge. Left eye exhibits no discharge. No scleral icterus.  Neck: Neck supple. No tracheal deviation present.  Cardiovascular: Normal rate.   Pulmonary/Chest: Effort normal. No stridor. No respiratory distress.  Genitourinary: There is bleeding in the vagina.  Large amount of clots in the vaginal vault, os is open, no products of conception  Musculoskeletal: She exhibits no edema.  Neurological: She is alert. Cranial nerve deficit: no gross deficits.  Skin: Skin is warm and dry. No rash noted.  Psychiatric: She has a normal mood and affect.  Nursing note and vitals reviewed.   ED Course  Procedures CRITICAL CARE Performed by: ZOXWR,UEAKNAPP,Ryliegh Mcduffey Total critical care time: 30 minutes Critical care time was exclusive of separately billable procedures and treating other patients. Critical care was necessary to treat or prevent imminent or life-threatening deterioration. Critical care was time spent personally by me on the following activities: development of treatment plan with patient and/or surrogate as well as nursing, discussions with consultants, evaluation of patient's response to treatment, examination of patient, obtaining history from patient or surrogate, ordering and performing treatments and interventions,  ordering and review of laboratory studies, ordering and review of radiographic studies, pulse oximetry and re-evaluation of patient's condition. MDM Pt's blood pressure improved.  Suspect possible vagal episode with her cramping and bleeding.    Will recheck her hemoglobin.  Continue to monitor closely.  2350  Repeat HGB showed a decrease but no blood transfusion necessary.   Pt states bleeding is decreasing.  Vitals remain stable.      Linwood DibblesJon Marilou Barnfield, MD 09/09/15 [phone]

## 2015-09-09 NOTE — ED Notes (Signed)
Bed: WA18 Expected date:  Expected time:  Means of arrival:  Comments: RESA 

## 2015-09-09 NOTE — ED Provider Notes (Signed)
CSN: [id number]     Arrival date & time 09/09/15  1750 History   First MD Initiated Contact with Patient 09/09/15 1757     Chief Complaint  Patient presents with  . Vaginal Bleeding     (Consider location/radiation/quality/duration/timing/severity/associated sxs/prior Treatment) HPI Robyn Miller is a 19 y.o. female presents to emergency department complaining of sudden onset of vaginal bleeding. Patient believes that she may be approximately 51 weeks old. Her last menstrual cycle was October 3. She has not had a prenatal visit yet. She states bleeding started suddenly while she was at work, approximately 30 minutes prior to coming to the emergency department. She reports she had some lower abdominal cramping earlier today, but denies any at this time. She denies any fever or chills. She denies any back pain. No urinary symptoms. No bleeding disorders. Does not take any medications daily. This is her first pregnancy. She does not have OB/GYN.  Past Medical History  Diagnosis Date  . Abscess    History reviewed. No pertinent past surgical history. No family history on file. Social History  Substance Use Topics  . Smoking status: Never Smoker   . Smokeless tobacco: None  . Alcohol Use: Yes     Comment: occasional   OB History    No data available     Review of Systems  Constitutional: Negative for fever and chills.  Respiratory: Negative for cough, chest tightness and shortness of breath.   Cardiovascular: Negative for chest pain, palpitations and leg swelling.  Gastrointestinal: Negative for nausea, vomiting, abdominal pain and diarrhea.  Genitourinary: Positive for vaginal bleeding and pelvic pain. Negative for dysuria, flank pain, vaginal discharge and vaginal pain.  Musculoskeletal: Negative for myalgias, arthralgias, neck pain and neck stiffness.  Skin: Negative for rash.  Neurological: Negative for dizziness, weakness and headaches.  All other systems reviewed and are  negative.     Allergies  Review of patient's allergies indicates no known allergies.  Home Medications   Prior to Admission medications   Not on File   BP 165/93 mmHg  Pulse 98  Temp(Src) 98.7 F (37.1 C) (Oral)  Resp 18  SpO2 100%  LMP 07/06/2015 Physical Exam  Constitutional: She appears well-developed and well-nourished. No distress.  HENT:  Head: Normocephalic.  Eyes: Conjunctivae are normal.  Neck: Neck supple.  Cardiovascular: Normal rate, regular rhythm and normal heart sounds.   Pulmonary/Chest: Effort normal and breath sounds normal. No respiratory distress. She has no wheezes. She has no rales.  Abdominal: Soft. Bowel sounds are normal. She exhibits no distension. There is no tenderness. There is no rebound.  Genitourinary:  Large bright red blood and clots in vaginal canal. Cervix closed. No uterine tenderness. No adnexal tenderness  Musculoskeletal: She exhibits no edema.  Neurological: She is alert.  Skin: Skin is warm and dry.  Psychiatric: She has a normal mood and affect. Her behavior is normal.  Nursing note and vitals reviewed.   ED Course  Procedures (including critical care time) Labs Review Labs Reviewed  HCG, QUANTITATIVE, PREGNANCY - Abnormal; Notable for the following:    hCG, Beta Chain, Quant, S 19226 (*)    All other components within normal limits  CBC WITH DIFFERENTIAL/PLATELET - Abnormal; Notable for the following:    Hemoglobin 11.4 (*)    HCT 34.7 (*)    All other components within normal limits  HEMOGLOBIN AND HEMATOCRIT, BLOOD - Abnormal; Notable for the following:    Hemoglobin 9.8 (*)    HCT  29.8 (*)    All other components within normal limits  I-STAT CHEM 8, ED - Abnormal; Notable for the following:    Potassium 3.4 (*)    Calcium, Ion 1.10 (*)    All other components within normal limits  CBC WITH DIFFERENTIAL/PLATELET  HEMOGLOBIN AND HEMATOCRIT, BLOOD  ABO/RH  GC/CHLAMYDIA PROBE AMP ([place]) NOT AT [facility]   SURGICAL PATHOLOGY    Imaging Review US Ob Comp Less 14 Wks  09/09/2015  CLINICAL DATA:  Vaginal bleeding and cramping. Gestational age by LMP of 9 weeks 2 days. EXAM: OBSTETRIC <14 WK [place] AND TRANSVAGINAL OB US TECHNIQUE: Both transabdominal and transvaginal ultrasound examinations were performed for complete evaluation of the gestation as well as the maternal uterus, adnexal regions, and pelvic cul-de-sac. Transvaginal technique was performed to assess early pregnancy. COMPARISON:  None. FINDINGS: Intrauterine gestational sac: Probable single gestational sac with irregular shape seen in endocervical canal Yolk sac:  Visualized Embryo:  Not visualized MSD: 14  mm   6 w   2  d Maternal uterus/adnexae: Complex fluid seen in the upper portion of the endometrial cavity likely representing subchorionic hemorrhage. Both ovaries are normal in appearance. No mass or free fluid identified. IMPRESSION: Abnormal appearing intrauterine gestational sac in endocervical canal. This likely represents a spontaneous abortion in progress, with cervical ectopic pregnancy considered less likely. Recommend followup with quantitative HCG levels and ultrasound as clinically warranted. Electronically Signed   By: Myles Rosenthal M.D.   On: 09/09/2015 20:00   US Ob Transvaginal  09/09/2015  CLINICAL DATA:  Vaginal bleeding and cramping. Gestational age by LMP of 9 weeks 2 days. EXAM: OBSTETRIC <14 WK [place] AND TRANSVAGINAL OB US TECHNIQUE: Both transabdominal and transvaginal ultrasound examinations were performed for complete evaluation of the gestation as well as the maternal uterus, adnexal regions, and pelvic cul-de-sac. Transvaginal technique was performed to assess early pregnancy. COMPARISON:  None. FINDINGS: Intrauterine gestational sac: Probable single gestational sac with irregular shape seen in endocervical canal Yolk sac:  Visualized Embryo:  Not visualized MSD: 14  mm   6 w   2  d Maternal uterus/adnexae: Complex fluid seen  in the upper portion of the endometrial cavity likely representing subchorionic hemorrhage. Both ovaries are normal in appearance. No mass or free fluid identified. IMPRESSION: Abnormal appearing intrauterine gestational sac in endocervical canal. This likely represents a spontaneous abortion in progress, with cervical ectopic pregnancy considered less likely. Recommend followup with quantitative HCG levels and ultrasound as clinically warranted. Electronically Signed   By: Myles Rosenthal M.D.   On: 09/09/2015 20:00   I have personally reviewed and evaluated these images and lab results as part of my medical decision-making.   EKG Interpretation None      MDM   Final diagnoses:  Vaginal bleeding  Miscarriage  Anemia, unspecified anemia type    Patient with large vaginal bleeding, believe she is pregnant. Will get labs, ultrasound, denies any pain at this time. Vital signs are normal.  Ultrasound shows active miscarriage. Discussed with Dr. Modena Morrow with OB/GYN, advised to give 800 mcg of Cytotec, monitor.   8:55 PM Pt had an episode of hypotension, diaphoresis. 2nd iv placed, fluid bulus given. After few min pt felt better. BP back to 120s and 130s systolic. Most likely vagal episode from pain vs watching blood drawn. Will continue to monitor closely. Dr. Lynelle Doctor at bedside, will repeat pelvic exam.   10:52 PM Pt feeling much better. Blood pressure continues to remain normally 1:30  systolic. Bleeding is slowing down after Cytotec. We'll continue to monitor.   1:54 AM Patient still bleeding, however bleeding significantly subsided. She states she feels weak and lightheaded. Her vital signs are normal. Will recheck hemoglobin again. Repeated pelvic exam, patient passed some tissue which was collected.   Discussed with Dr. Despina HiddenEure, asked to transfer to MAU.   Filed Vitals:   09/09/15 2345 09/10/15 0000 09/10/15 0030 09/10/15 0154  BP: 124/80 124/83 127/78 122/69  Pulse:  83 93 101  Temp:       TempSrc:      Resp:  19 13 22   SpO2:  100% 100% 100%     Jaynie Crumbleatyana Glender [place], PA-C 09/10/15 0217  Jaynie Crumbleatyana Dorethia Jeanmarie, PA-C 09/11/15 0033  Linwood DibblesJon Knapp, MD 09/11/15 1101

## 2015-09-10 DIAGNOSIS — O021 Missed abortion: Secondary | ICD-10-CM | POA: Diagnosis not present

## 2015-09-10 DIAGNOSIS — O99011 Anemia complicating pregnancy, first trimester: Secondary | ICD-10-CM

## 2015-09-10 LAB — CBC
HEMATOCRIT: 20.5 % — AB (ref 36.0–46.0)
Hemoglobin: 7 g/dL — ABNORMAL LOW (ref 12.0–15.0)
MCH: 27.8 pg (ref 26.0–34.0)
MCHC: 34.1 g/dL (ref 30.0–36.0)
MCV: 81.3 fL (ref 78.0–100.0)
PLATELETS: 178 10*3/uL (ref 150–400)
RBC: 2.52 MIL/uL — ABNORMAL LOW (ref 3.87–5.11)
RDW: 13.4 % (ref 11.5–15.5)
WBC: 8.3 10*3/uL (ref 4.0–10.5)

## 2015-09-10 LAB — HEMOGLOBIN AND HEMATOCRIT, BLOOD
HEMATOCRIT: 23.7 % — AB (ref 36.0–46.0)
Hemoglobin: 8 g/dL — ABNORMAL LOW (ref 12.0–15.0)

## 2015-09-10 LAB — ABO/RH: ABO/RH(D): A POS

## 2015-09-10 LAB — PREPARE RBC (CROSSMATCH)

## 2015-09-10 MED ORDER — MISOPROSTOL 200 MCG PO TABS
800.0000 ug | ORAL_TABLET | Freq: Once | ORAL | Status: AC
  Administered 2015-09-10: 800 ug via ORAL
  Filled 2015-09-10: qty 4

## 2015-09-10 MED ORDER — MISOPROSTOL 200 MCG PO TABS: 200.0000 ug | ORAL_TABLET | Freq: Two times a day (BID) | ORAL | Status: DC

## 2015-09-10 MED ORDER — FERROUS SULFATE 325 (65 FE) MG PO TABS: 325.0000 mg | ORAL_TABLET | Freq: Every day | ORAL | Status: DC

## 2015-09-10 MED ORDER — HYDROCODONE-ACETAMINOPHEN 5-325 MG PO TABS: 1.0000 | ORAL_TABLET | ORAL | Status: DC | PRN

## 2015-09-10 NOTE — MAU Provider Note (Signed)
History     CSN: [id number]  Arrival date and time: 09/09/15 1750   First Provider Initiated Contact with Patient 09/10/15 0421      Chief Complaint  Patient presents with  . Vaginal Bleeding  . Miscarriage   HPI Comments: Robyn Miller is a 19 y.o. G1P0 at 9 weeks 3 days by LMP. She was transferred from [facility] after heavy bleeding over there. It was noted that there was a significant drop in her HCG. She was given cytotec there and then had a pre-syncopal episode. She states that since having the cytotec her bleeding has started to decrease significantly. She reports no-mild pain at this time.   Please see notes from ED for care prior to arrival here at [facility].   Vaginal Bleeding The patient's primary symptoms include vaginal bleeding. This is a new problem. The current episode started today. The problem occurs constantly. The problem has been gradually improving. The patient is experiencing no pain. She is pregnant. Pertinent negatives include no abdominal pain, chills, constipation, diarrhea, dysuria, fever, frequency, nausea, urgency or vomiting. The vaginal discharge was bloody. The vaginal bleeding is lighter than menses. She has not been passing clots. She has not been passing tissue. Nothing aggravates the symptoms. Treatments tried: patient had cyotec in the ED. IV fluids  The treatment provided significant relief.    Past Medical History  Diagnosis Date  . Abscess     History reviewed. No pertinent past surgical history.  No family history on file.  Social History  Substance Use Topics  . Smoking status: Never Smoker   . Smokeless tobacco: None  . Alcohol Use: Yes     Comment: occasional    Allergies: No Known Allergies  No prescriptions prior to admission    Review of Systems  Constitutional: Negative for fever and chills.  Gastrointestinal: Negative for nausea, vomiting, abdominal pain, diarrhea and constipation.  Genitourinary: Positive for  vaginal bleeding. Negative for dysuria, urgency and frequency.   Physical Exam   Blood pressure 128/61, pulse 89, temperature 98.5 F (36.9 C), temperature source Oral, resp. rate 18, last menstrual period 07/06/2015, SpO2 100 %.  Physical Exam  Nursing note reviewed. Constitutional: She is oriented to person, place, and time. She appears well-developed and well-nourished. No distress.  HENT:  Head: Normocephalic.  Cardiovascular: Normal rate.   Respiratory: Effort normal.  GI: Soft. There is no tenderness.  Genitourinary:   External: no lesion Vagina: small amount of blood seen  Cervix: pink, smooth, some tissue seen at cervical os. Unable to dislodge the tissue with gentle manipulation.  Uterus: NSSC Adnexa: NT   Neurological: She is alert and oriented to person, place, and time.  Skin: Skin is warm and dry.  Psychiatric: She has a normal mood and affect.   Results for orders placed or performed during the hospital encounter of 09/09/15 (from the past 24 hour(s))  ABO/Rh     Status: None   Collection Time: 09/09/15  6:10 PM  Result Value Ref Range   ABO/RH(D) A POS   hCG, quantitative, pregnancy     Status: Abnormal   Collection Time: 09/09/15  6:14 PM  Result Value Ref Range   hCG, Beta Chain, Quant, S 19226 (H) <5 mIU/mL  I-Stat Chem 8, ED     Status: Abnormal   Collection Time: 09/09/15  6:26 PM  Result Value Ref Range   Sodium 140 135 - 145 mmol/L   Potassium 3.4 (L) 3.5 - 5.1 mmol/L  Chloride 104 101 - 111 mmol/L   BUN 9 6 - 20 mg/dL   Creatinine, Ser 1.610.70 0.44 - 1.00 mg/dL   Glucose, Bld 81 65 - 99 mg/dL   Calcium, Ion 0.961.10 (L) 1.12 - 1.23 mmol/L   TCO2 24 0 - 100 mmol/L   Hemoglobin 13.6 12.0 - 15.0 g/dL   HCT 04.540.0 40.936.0 - 81.146.0 %  CBC with Differential/Platelet     Status: Abnormal   Collection Time: 09/09/15  8:40 PM  Result Value Ref Range   WBC 9.3 4.0 - 10.5 K/uL   RBC 4.17 3.87 - 5.11 MIL/uL   Hemoglobin 11.4 (L) 12.0 - 15.0 g/dL   HCT 91.434.7 (L)  78.236.0 - 46.0 %   MCV 83.2 78.0 - 100.0 fL   MCH 27.3 26.0 - 34.0 pg   MCHC 32.9 30.0 - 36.0 g/dL   RDW 95.613.4 21.311.5 - 08.615.5 %   Platelets 291 150 - 400 K/uL   Neutrophils Relative % 68 %   Neutro Abs 6.3 1.7 - 7.7 K/uL   Lymphocytes Relative 28 %   Lymphs Abs 2.6 0.7 - 4.0 K/uL   Monocytes Relative 4 %   Monocytes Absolute 0.4 0.1 - 1.0 K/uL   Eosinophils Relative 0 %   Eosinophils Absolute 0.0 0.0 - 0.7 K/uL   Basophils Relative 0 %   Basophils Absolute 0.0 0.0 - 0.1 K/uL  Hemoglobin and hematocrit, blood     Status: Abnormal   Collection Time: 09/09/15  9:08 PM  Result Value Ref Range   Hemoglobin 9.8 (L) 12.0 - 15.0 g/dL   HCT 57.829.8 (L) 46.936.0 - 62.946.0 %  Hemoglobin and hematocrit, blood     Status: Abnormal   Collection Time: 09/10/15  1:55 AM  Result Value Ref Range   Hemoglobin 8.0 (L) 12.0 - 15.0 g/dL   HCT 52.823.7 (L) 41.336.0 - 24.446.0 %  CBC     Status: Abnormal   Collection Time: 09/10/15  3:34 AM  Result Value Ref Range   WBC 8.3 4.0 - 10.5 K/uL   RBC 2.52 (L) 3.87 - 5.11 MIL/uL   Hemoglobin 7.0 (L) 12.0 - 15.0 g/dL   HCT 01.020.5 (L) 27.236.0 - 53.646.0 %   MCV 81.3 78.0 - 100.0 fL   MCH 27.8 26.0 - 34.0 pg   MCHC 34.1 30.0 - 36.0 g/dL   RDW 64.413.4 03.411.5 - 74.215.5 %   Platelets 178 150 - 400 K/uL  [facility]     Status: None   Collection Time: 09/10/15  3:34 AM  Result Value Ref Range   ABO/RH(D) A POS    Antibody Screen NEG    Sample Expiration 09/13/2015   Prepare RBC (crossmatch)     Status: None   Collection Time: 09/10/15  4:00 AM  Result Value Ref Range   Order Confirmation ORDER PROCESSED BY BLOOD BANK     MAU Course  Procedures  MDM 0444: D/W Dr. Despina HiddenEure, tissue right at os, and I was not able to dislodge it. He would like her to have 800mcg of cytotec orally, and then he will come here to evaluate her in about 30 mins.  420531: Dr. Despina HiddenEure here, and examined the patient. No tissue at the cervical os. Will DC home on iron replacement.    Assessment  and Plan   1. Miscarriage   2. Bleeding   3. Vaginal bleeding   4. Anemia, unspecified anemia type    DC home Comfort  measures reviewed  Bleeding precautions RX: vicodin PRN, Cytotec  BID x 3 days, iron replacement daily Return to MAU as needed FU with OB as planned  Follow-up Information    Follow up with [facility] In 2 weeks.   Specialty:  Obstetrics and Gynecology   Why:  They will call you with an appointment    Contact information:   [address] [place] [address] [phone]        Tawnya Crook 09/10/2015, 4:25 AM

## 2015-09-10 NOTE — Discharge Instructions (Signed)
Miscarriage  A miscarriage is the sudden loss of an unborn baby (fetus) before the 20th week of pregnancy. Most miscarriages happen in the first 3 months of pregnancy. Sometimes, it happens before a woman even knows she is pregnant. A miscarriage is also called a "spontaneous miscarriage" or "early pregnancy loss." Having a miscarriage can be an emotional experience. Talk with your caregiver about any questions you may have about miscarrying, the grieving process, and your future pregnancy plans.  CAUSES    Problems with the fetal chromosomes that make it impossible for the baby to develop normally. Problems with the baby's genes or chromosomes are most often the result of errors that occur, by chance, as the embryo divides and grows. The problems are not inherited from the parents.   Infection of the cervix or uterus.    Hormone problems.    Problems with the cervix, such as having an incompetent cervix. This is when the tissue in the cervix is not strong enough to hold the pregnancy.    Problems with the uterus, such as an abnormally shaped uterus, uterine fibroids, or congenital abnormalities.    Certain medical conditions.    Smoking, drinking alcohol, or taking illegal drugs.    Trauma.   Often, the cause of a miscarriage is unknown.   SYMPTOMS    Vaginal bleeding or spotting, with or without cramps or pain.   Pain or cramping in the abdomen or lower back.   Passing fluid, tissue, or blood clots from the vagina.  DIAGNOSIS   Your caregiver will perform a physical exam. You may also have an ultrasound to confirm the miscarriage. Blood or urine tests may also be ordered.  TREATMENT    Sometimes, treatment is not necessary if you naturally pass all the fetal tissue that was in the uterus. If some of the fetus or placenta remains in the body (incomplete miscarriage), tissue left behind may become infected and must be removed. Usually, a dilation and curettage (D and C) procedure is performed.  During a D and C procedure, the cervix is widened (dilated) and any remaining fetal or placental tissue is gently removed from the uterus.   Antibiotic medicines are prescribed if there is an infection. Other medicines may be given to reduce the size of the uterus (contract) if there is a lot of bleeding.   If you have Rh negative blood and your baby was Rh positive, you will need a Rh immunoglobulin shot. This shot will protect any future baby from having Rh blood problems in future pregnancies.  HOME CARE INSTRUCTIONS    Your caregiver may order bed rest or may allow you to continue light activity. Resume activity as directed by your caregiver.   Have someone help with home and family responsibilities during this time.    Keep track of the number of sanitary pads you use each day and how soaked (saturated) they are. Write down this information.    Do not use tampons. Do not douche or have sexual intercourse until approved by your caregiver.    Only take over-the-counter or prescription medicines for pain or discomfort as directed by your caregiver.    Do not take aspirin. Aspirin can cause bleeding.    Keep all follow-up appointments with your caregiver.    If you or your partner have problems with grieving, talk to your caregiver or seek counseling to help cope with the pregnancy loss. Allow enough time to grieve before trying to get pregnant again.     SEEK IMMEDIATE MEDICAL CARE IF:    You have severe cramps or pain in your back or abdomen.   You have a fever.   You pass large blood clots (walnut-sized or larger) ortissue from your vagina. Save any tissue for your caregiver to inspect.    Your bleeding increases.    You have a thick, bad-smelling vaginal discharge.   You become lightheaded, weak, or you faint.    You have chills.   MAKE SURE YOU:   Understand these instructions.   Will watch your condition.   Will get help right away if you are not doing well or get worse.     This  information is not intended to replace advice given to you by your health care provider. Make sure you discuss any questions you have with your health care provider.     Document Released: 03/15/2001 Document Revised: 01/14/2013 Document Reviewed: 11/08/2011  Elsevier Interactive Patient Education 2016 Elsevier Inc.

## 2015-09-13 LAB — TYPE AND SCREEN
ABO/RH(D): A POS
ANTIBODY SCREEN: NEGATIVE
Unit division: 0
Unit division: 0

## 2015-09-24 ENCOUNTER — Encounter: Payer: Medicaid Other | Admitting: Obstetrics & Gynecology

## 2015-10-01 ENCOUNTER — Ambulatory Visit (INDEPENDENT_AMBULATORY_CARE_PROVIDER_SITE_OTHER): Payer: Medicaid Other | Admitting: Family Medicine

## 2015-10-01 VITALS — BP 137/58 | HR 82 | Temp 98.2°F | Resp 18 | Wt 171.5 lb

## 2015-10-01 DIAGNOSIS — O039 Complete or unspecified spontaneous abortion without complication: Secondary | ICD-10-CM | POA: Diagnosis not present

## 2015-10-01 DIAGNOSIS — D62 Acute posthemorrhagic anemia: Secondary | ICD-10-CM

## 2015-10-01 LAB — CBC
HEMATOCRIT: 24.5 % — AB (ref 36.0–46.0)
HEMOGLOBIN: 7.6 g/dL — AB (ref 12.0–15.0)
MCH: 24.4 pg — AB (ref 26.0–34.0)
MCHC: 31 g/dL (ref 30.0–36.0)
MCV: 78.8 fL (ref 78.0–100.0)
MPV: 10 fL (ref 8.6–12.4)
Platelets: 398 10*3/uL (ref 150–400)
RBC: 3.11 MIL/uL — AB (ref 3.87–5.11)
RDW: 15.2 % (ref 11.5–15.5)
WBC: 6.4 10*3/uL (ref 4.0–10.5)

## 2015-10-01 MED ORDER — NORGESTIMATE-ETH ESTRADIOL 0.25-35 MG-MCG PO TABS: 1.0000 | ORAL_TABLET | Freq: Every day | ORAL | Status: DC

## 2015-10-01 NOTE — Patient Instructions (Signed)
Anemia, Nonspecific Anemia is a condition in which the concentration of red blood cells or hemoglobin in the blood is below normal. Hemoglobin is a substance in red blood cells that carries oxygen to the tissues of the body. Anemia results in not enough oxygen reaching these tissues.  CAUSES  Common causes of anemia include:   Excessive bleeding. Bleeding may be internal or external. This includes excessive bleeding from periods (in women) or from the intestine.   Poor nutrition.   Chronic kidney, thyroid, and liver disease.  Bone marrow disorders that decrease red blood cell production.  Cancer and treatments for cancer.  HIV, AIDS, and their treatments.  Spleen problems that increase red blood cell destruction.  Blood disorders.  Excess destruction of red blood cells due to infection, medicines, and autoimmune disorders. SIGNS AND SYMPTOMS   Minor weakness.   Dizziness.   Headache.  Palpitations.   Shortness of breath, especially with exercise.   Paleness.  Cold sensitivity.  Indigestion.  Nausea.  Difficulty sleeping.  Difficulty concentrating. Symptoms may occur suddenly or they may develop slowly.  DIAGNOSIS  Additional blood tests are often needed. These help your health care provider determine the best treatment. Your health care provider will check your stool for blood and look for other causes of blood loss.  TREATMENT  Treatment varies depending on the cause of the anemia. Treatment can include:   Supplements of iron, vitamin B12, or folic acid.   Hormone medicines.   A blood transfusion. This may be needed if blood loss is severe.   Hospitalization. This may be needed if there is significant continual blood loss.   Dietary changes.  Spleen removal. HOME CARE INSTRUCTIONS Keep all follow-up appointments. It often takes many weeks to correct anemia, and having your health care provider check on your condition and your response to  treatment is very important. SEEK IMMEDIATE MEDICAL CARE IF:   You develop extreme weakness, shortness of breath, or chest pain.   You become dizzy or have trouble concentrating.  You develop heavy vaginal bleeding.   You develop a rash.   You have bloody or black, tarry stools.   You faint.   You vomit up blood.   You vomit repeatedly.   You have abdominal pain.  You have a fever or persistent symptoms for more than 2-3 days.   You have a fever and your symptoms suddenly get worse.   You are dehydrated.  MAKE SURE YOU:  Understand these instructions.  Will watch your condition.  Will get help right away if you are not doing well or get worse.   This information is not intended to replace advice given to you by your health care provider. Make sure you discuss any questions you have with your health care provider.   Document Released: 10/27/2004 Document Revised: 05/22/2013 Document Reviewed: 03/15/2013 Elsevier Interactive Patient Education 2016 Elsevier Inc.  

## 2015-10-01 NOTE — Progress Notes (Signed)
   Subjective:    Patient ID: Robyn Miller, female    DOB: 12/02/1995, 19 y.o.   MRN: [id number]  HPI Patient seen for follow up of miscarriage.  This was an unintended pregnancy.  Was seen in ED/MAU on 12/7 with vaginal bleeding and severe anemia. Passed tissue in MAU.  Bled until 12/14.  Since then, has not had any problems.     Review of Systems     Objective:   Physical Exam  Constitutional: She appears well-developed and well-nourished.  HENT:  Head: Normocephalic and atraumatic.  Cardiovascular: Normal rate, regular rhythm and normal heart sounds.   Pulmonary/Chest: Effort normal and breath sounds normal.  Abdominal: Soft. Bowel sounds are normal. She exhibits no distension and no mass. There is no tenderness. There is no rebound and no guarding.  Skin: Skin is warm and dry. No rash noted. No erythema. No pallor.  Psychiatric: She has a normal mood and affect. Her behavior is normal. Judgment and thought content normal.      Assessment & Plan:  1. SAB (spontaneous abortion) Discussed that should not get pregnant for 3-6 months.  She would like contraception.  Will start OCP - CBC - hCG, quantitative, pregnancy  2. Acute blood loss anemia Continue iron.  CBC today.

## 2015-10-02 LAB — HCG, QUANTITATIVE, PREGNANCY: HCG, BETA CHAIN, QUANT, S: 12.7 m[IU]/mL — AB

## 2015-10-05 ENCOUNTER — Telehealth: Payer: Self-pay | Admitting: *Deleted

## 2015-10-05 NOTE — Telephone Encounter (Signed)
Per Dr. Adrian BlackwaterStinson patient needs repeat hcg level in 1 week. Called patient and left message that we are calling with results.

## 2015-10-12 ENCOUNTER — Encounter: Payer: Self-pay | Admitting: *Deleted

## 2015-10-12 NOTE — Telephone Encounter (Signed)
Attempted to call patient again. No answer, left voice mail stating I was calling her regarding test results and need to schedule her for another lab draw. Asked her to please return our call at the clinics. Letter sent as well.

## 2016-05-09 ENCOUNTER — Emergency Department (HOSPITAL_COMMUNITY)
Admission: EM | Admit: 2016-05-09 | Discharge: 2016-05-09 | Disposition: A | Discharge status: Home / Self Care | Payer: BLUE CROSS/BLUE SHIELD | Attending: Emergency Medicine | Admitting: Emergency Medicine

## 2016-05-09 ENCOUNTER — Encounter (HOSPITAL_COMMUNITY): Payer: Self-pay

## 2016-05-09 DIAGNOSIS — K0889 Other specified disorders of teeth and supporting structures: Secondary | ICD-10-CM | POA: Insufficient documentation

## 2016-05-09 DIAGNOSIS — K0381 Cracked tooth: Secondary | ICD-10-CM | POA: Insufficient documentation

## 2016-05-09 MED ORDER — PENICILLIN V POTASSIUM 500 MG PO TABS: 500.0000 mg | ORAL_TABLET | Freq: Three times a day (TID) | ORAL | 0 refills | Status: DC

## 2016-05-09 MED ORDER — OXYCODONE-ACETAMINOPHEN 5-325 MG PO TABS
1.0000 | ORAL_TABLET | Freq: Once | ORAL | Status: AC
  Administered 2016-05-09: 1 via ORAL
  Filled 2016-05-09: qty 1

## 2016-05-09 MED ORDER — NAPROXEN 500 MG PO TABS: 500.0000 mg | ORAL_TABLET | Freq: Two times a day (BID) | ORAL | 0 refills | Status: DC

## 2016-05-09 NOTE — ED Provider Notes (Signed)
[facility]-EMERGENCY DEPT Provider Note   CSN: [id number] Arrival date & time: 05/09/16  [id number]  First Provider Contact:   First MD Initiated Contact with Patient 05/09/16 1909     By signing my name below, I, Freida Busmaniana Omoyeni, attest that this documentation has been prepared under the direction and in the presence of non-physician practitioner, Felicie Mornavid Samarrah Tranchina, NP. Electronically Signed: Freida Busmaniana Omoyeni, Scribe. 05/09/2016. 7:15 PM.  History   Chief Complaint Chief Complaint  Patient presents with  . Dental Pain    The history is provided by the patient. No language interpreter was used.  Dental Pain   This is a new problem. The current episode started 3 to 5 hours ago. The problem has not changed since onset.The pain is moderate. She has tried nothing for the symptoms.    HPI Comments:  Robyn Miller is a 20 y.o. female who presents to the Emergency Department complaining of constant, moderate, right lower dental pain which began today. Pt states she cracked a filling. She reports mild associated swelling to the right cheek. She has a dental appointment scheduled for  later this week. She denies difficulty swallowing. Pt has no other complaints or symptoms at this time.    Past Medical History:  Diagnosis Date  . Abscess     There are no active problems to display for this patient.   History reviewed. No pertinent surgical history.  OB History    No data available       Home Medications    Prior to Admission medications   Medication Sig Start Date End Date Taking? Authorizing Provider  ferrous sulfate 325 (65 FE) MG tablet Take 1 tablet (325 mg total) by mouth daily with breakfast. 09/10/15   Armando ReichertHeather D Hogan, CNM  naproxen (NAPROSYN) 500 MG tablet Take 1 tablet (500 mg total) by mouth 2 (two) times daily. 05/09/16   Felicie Mornavid Leroy Trim, NP  norgestimate-ethinyl estradiol (ORTHO-CYCLEN,SPRINTEC,PREVIFEM) 0.25-35 MG-MCG tablet Take 1 tablet by mouth daily. 10/01/15   Levie HeritageJacob J Stinson, DO    penicillin v potassium (VEETID) 500 MG tablet Take 1 tablet (500 mg total) by mouth 3 (three) times daily. 05/09/16   Felicie Mornavid Tamiko Leopard, NP    Family History History reviewed. No pertinent family history.  Social History Social History  Substance Use Topics  . Smoking status: Never Smoker  . Smokeless tobacco: Never Used  . Alcohol use Yes     Comment: occasional     Allergies   Review of patient's allergies indicates no known allergies.   Review of Systems Review of Systems  Constitutional: Negative for fever.  HENT: Positive for dental problem and facial swelling. Negative for trouble swallowing.   Respiratory: Negative for shortness of breath.      Physical Exam Updated Vital Signs BP 172/93 (BP Location: Left Arm)   Pulse 64   Temp 98.6 F (37 C) (Oral)   Resp 12   Ht 5\' 8"  (1.727 m)   Wt 178 lb (80.7 kg)   LMP 05/09/2016   SpO2 100%   BMI 27.06 kg/m   Physical Exam  Constitutional: She is oriented to person, place, and time. She appears well-developed and well-nourished. No distress.  HENT:  Head: Normocephalic and atraumatic.  Cracked tooth, 2nd from back on the lower right  Eyes: Conjunctivae are normal.  Cardiovascular: Normal rate.   Pulmonary/Chest: Effort normal.  Neurological: She is alert and oriented to person, place, and time.  Skin: Skin is warm and dry.  Psychiatric: She has  a normal mood and affect.  Nursing note and vitals reviewed.    ED Treatments / Results  DIAGNOSTIC STUDIES:  Oxygen Saturation is 100% on RA, normal by my interpretation.    COORDINATION OF CARE:  7:11 PM Discussed treatment plan with pt at bedside and pt agreed to plan.  Procedures Procedures   Medications Ordered in ED Medications  oxyCODONE-acetaminophen (PERCOCET/ROXICET) 5-325 MG per tablet 1 tablet (not administered)     Initial Impression / Assessment and Plan / ED Course  I have reviewed the triage vital signs and the nursing notes.  Pertinent labs  & imaging results that were available during my care of the patient were reviewed by me and considered in my medical decision making (see chart for details).  Clinical Course    Patient with dentalgia.  No abscess requiring immediate incision and drainage.  Exam not concerning for Ludwig's angina or pharyngeal abscess.  Will treat with penicillin and naprosyn. Pt instructed to keep follow-up with dentist.  Discussed return precautions. Pt safe for discharge.   Final Clinical Impressions(s) / ED Diagnoses   Final diagnoses:  Pain, dental    New Prescriptions New Prescriptions   NAPROXEN (NAPROSYN) 500 MG TABLET    Take 1 tablet (500 mg total) by mouth 2 (two) times daily.   PENICILLIN V POTASSIUM (VEETID) 500 MG TABLET    Take 1 tablet (500 mg total) by mouth 3 (three) times daily.    I personally performed the services described in this documentation, which was scribed in my presence. The recorded information has been reviewed and is accurate.     Felicie Morn, NP 05/10/16 1610    Pricilla Loveless, MD 05/12/16 [phone]

## 2016-05-09 NOTE — ED Triage Notes (Signed)
Pt states cracked a filling on bottom right. States eating candy, broke tooth.

## 2016-05-11 ENCOUNTER — Emergency Department (HOSPITAL_COMMUNITY)
Admission: EM | Admit: 2016-05-11 | Discharge: 2016-05-11 | Disposition: A | Discharge status: Home / Self Care | Payer: BLUE CROSS/BLUE SHIELD | Attending: Emergency Medicine | Admitting: Emergency Medicine

## 2016-05-11 ENCOUNTER — Encounter (HOSPITAL_COMMUNITY): Payer: Self-pay | Admitting: *Deleted

## 2016-05-11 ENCOUNTER — Emergency Department (HOSPITAL_COMMUNITY): Payer: BLUE CROSS/BLUE SHIELD

## 2016-05-11 DIAGNOSIS — K047 Periapical abscess without sinus: Secondary | ICD-10-CM | POA: Insufficient documentation

## 2016-05-11 DIAGNOSIS — L03211 Cellulitis of face: Secondary | ICD-10-CM | POA: Diagnosis not present

## 2016-05-11 DIAGNOSIS — K0889 Other specified disorders of teeth and supporting structures: Secondary | ICD-10-CM | POA: Diagnosis present

## 2016-05-11 DIAGNOSIS — L03818 Cellulitis of other sites: Secondary | ICD-10-CM

## 2016-05-11 LAB — I-STAT CREATININE, ED: CREATININE: 0.6 mg/dL (ref 0.44–1.00)

## 2016-05-11 MED ORDER — CLINDAMYCIN HCL 150 MG PO CAPS
450.0000 mg | ORAL_CAPSULE | Freq: Once | ORAL | Status: AC
  Administered 2016-05-11: 450 mg via ORAL
  Filled 2016-05-11: qty 3

## 2016-05-11 MED ORDER — IOPAMIDOL (ISOVUE-300) INJECTION 61%
INTRAVENOUS | Status: AC
  Administered 2016-05-11: 75 mL
  Filled 2016-05-11: qty 75

## 2016-05-11 MED ORDER — CLINDAMYCIN HCL 300 MG PO CAPS: 300.0000 mg | ORAL_CAPSULE | Freq: Four times a day (QID) | ORAL | 0 refills | Status: DC

## 2016-05-11 NOTE — ED Provider Notes (Signed)
[facility]-EMERGENCY DEPT Provider Note  By signing my name below, I, Robyn Miller, attest that this documentation has been prepared under the direction and in the presence of Treatment Team:  Physician Assistant: Chase PicketJaime Pilcher Ward, PA-C.  Electronically Signed: Arvilla MarketMesha Miller, Medical Scribe. 05/11/16. 12:21 PM.  CSN: [id number] Arrival date & time: 05/11/16  1158  First Provider Contact:   First MD Initiated Contact with Patient 05/11/16 1221      History   Chief Complaint Chief Complaint  Patient presents with  . Dental Pain    HPI Comments: Robyn Miller is a 20 y.o. female who presents to the Emergency Department complaining of right side face swelling onset this morning when she woke up. Pt mentions she didn't have any facial swelling yesterday. She states that she is having associated symptoms of right side dental pain, and pain on the right side of her face. She states that she has tried Naprosyn, and Veetid and it gave her some relief of her dental pain. Pt mentions her dental appointment is in 3 weeks. She denies drainage from her tooth abscess, trouble breathing, trouble swallowing and any other symptoms.  The history is provided by the patient. No language interpreter was used.  Dental Pain      Past Medical History:  Diagnosis Date  . Abscess     There are no active problems to display for this patient.   History reviewed. No pertinent surgical history.  OB History    No data available       Home Medications    Prior to Admission medications   Medication Sig Start Date End Date Taking? Authorizing Provider  clindamycin (CLEOCIN) 300 MG capsule Take 1 capsule (300 mg total) by mouth 4 (four) times daily. 05/11/16   Chase PicketJaime Pilcher Ward, PA-C  ferrous sulfate 325 (65 FE) MG tablet Take 1 tablet (325 mg total) by mouth daily with breakfast. 09/10/15   Armando ReichertHeather D Hogan, CNM  naproxen (NAPROSYN) 500 MG tablet Take 1 tablet (500 mg total) by mouth 2 (two) times daily.  05/09/16   Felicie Mornavid Smith, NP  norgestimate-ethinyl estradiol (ORTHO-CYCLEN,SPRINTEC,PREVIFEM) 0.25-35 MG-MCG tablet Take 1 tablet by mouth daily. 10/01/15   Levie HeritageJacob J Stinson, DO  penicillin v potassium (VEETID) 500 MG tablet Take 1 tablet (500 mg total) by mouth 3 (three) times daily. 05/09/16   Felicie Mornavid Smith, NP    Family History History reviewed. No pertinent family history.  Social History Social History  Substance Use Topics  . Smoking status: Never Smoker  . Smokeless tobacco: Never Used  . Alcohol use Yes     Comment: occasional     Allergies   Review of patient's allergies indicates no known allergies.   Review of Systems Review of Systems  HENT: Positive for dental problem and facial swelling. Negative for trouble swallowing.   Respiratory: Negative for shortness of breath and wheezing.      Physical Exam Updated Vital Signs BP 134/92 (BP Location: Right Arm)   Pulse 83   Temp 98.5 F (36.9 C) (Oral)   Resp 20   Ht 5\' 8"  (1.727 m)   Wt 80.7 kg   LMP 05/08/2016   SpO2 100%   BMI 27.06 kg/m   Physical Exam  Constitutional: She is oriented to person, place, and time. She appears well-developed and well-nourished. No distress.  Speaking in full sentences.  HENT:  Head: Normocephalic and atraumatic.  Mouth/Throat:    Patent airway. Dental cavities and poor oral dentition noted. Pain  along tooth as depicted in image. No abscess appreciated. Midline uvula, no trismus, oropharynx clear and moist. No sublingual tenderness or swelling. Patient does have significant right-sided facial swelling. Patient also has tenderness to right submandibular region.  Cardiovascular: Normal rate, regular rhythm, normal heart sounds and intact distal pulses.  Exam reveals no gallop and no friction rub.   No murmur heard. Pulmonary/Chest: Effort normal and breath sounds normal. No respiratory distress. She has no wheezes. She has no rales. She exhibits no tenderness.  Abdominal: Soft. She  exhibits no distension. There is no tenderness.  Musculoskeletal: She exhibits no edema.  Neurological: She is alert and oriented to person, place, and time.  Skin: Skin is warm and dry.  Nursing note and vitals reviewed.  ED Treatments / Results  Labs (all labs ordered are listed, but only abnormal results are displayed) Labs Reviewed  I-STAT CREATININE, ED   DIAGNOSTIC STUDIES: Oxygen Saturation is 99% on RA, nl by my interpretation.    COORDINATION OF CARE: 4:30 PM Discussed treatment plan with pt at bedside and pt agreed to plan.   EKG  EKG Interpretation None       Radiology Ct Soft Tissue Neck W Contrast  Result Date: 05/11/2016 CLINICAL DATA:  20 year old female with right facial swelling about the cheek and jaw since this morning. Associated dental pain. Initial encounter. EXAM: CT NECK WITH CONTRAST TECHNIQUE: Multidetector CT imaging of the neck was performed using the standard protocol following the bolus administration of intravenous contrast. CONTRAST:  75mL ISOVUE-300 IOPAMIDOL (ISOVUE-300) INJECTION 61% COMPARISON:  None. FINDINGS: Pharynx and larynx: Negative larynx and pharynx soft tissues. Negative parapharyngeal and retropharyngeal spaces. Salivary glands: The sublingual space remains normal. Submandibular glands are normal. Left parotid gland is normal. There is confluent soft tissue swelling and stranding along the right buccal space an involving the right face from the level of the right parotid duct to the level of the right submandibular gland. There is a small volume of tracking fluid along the right platysma which is thickened. Along the posterior body of the right mandible there is a periosteal abscess encompassing 9 x 5 x 11 mm (AP by transverse by CC). This is located adjacent to the anterior right mandible molar which exhibits large dental caries, and large periapical lucency - especially along the posterior root (sagittal image 21). There is secondary  involvement of the inferior right masticator space. There is mild/reactive right level 1 B lymphadenopathy. Although the anterior aspect of the right parotid gland and right parotid duct may be Secondarily inflamed, the majority of the right parotid gland is normal. Thyroid: Negative. Lymph nodes: Reactive right level 1 lymphadenopathy. Borderline to mild right level 2 lymphadenopathy. No cystic or necrotic nodes. Other nodal levels are normal for age. Vascular: Major vascular structures in the neck and at the skullbase are patent including the right IJ. Limited intracranial: Negative. Visualized orbits: Negative. Mastoids and visualized paranasal sinuses: Hyperplastic paranasal sinuses. Bilateral sphenoid sinus mucous retention cysts. Otherwise clear. Skeleton: Dental infectious changes about the anterior right mandible molar as detailed above. Minimal cervical spine anterior endplate spurring. Upper chest: Negative visualized superior mediastinum and axillary lymph nodes. Negative lung apices. IMPRESSION: 1. Carious anterior molar of the right mandible with apical and periosteal abscess (9 x 11 mm). Associated right facial cellulitis with secondary involvement of the right masticator space, and portions of the right parotid and submandibular spaces. Reactive lymphadenopathy. 2. Incidental sphenoid sinus mucous retention cysts. Electronically Signed   By: Rexene Edison  Margo Aye M.D.   On: 05/11/2016 14:57    Procedures Procedures (including critical care time)  Medications Ordered in ED Medications  iopamidol (ISOVUE-300) 61 % injection (75 mLs  Contrast Given 05/11/16 1437)  clindamycin (CLEOCIN) capsule 450 mg (450 mg Oral Given 05/11/16 1545)     Initial Impression / Assessment and Plan / ED Course  I have reviewed the triage vital signs and the nursing notes.  Pertinent labs & imaging results that were available during my care of the patient were reviewed by me and considered in my medical decision making (see  chart for details).  Clinical Course   TRANISHA TISSUE is a 20 y.o. female who presents to ED for dental pain x 2 days and facial swelling that is new today. Seen in ED on 8/07 (2 days ago) where she was started on PCN and given naproxen for pain. Patient states pain has worsened and now face is swollen. On exam, patient is speaking in full sentences with a patent airway. There is no sublingual swelling or tenderness, however she does have submandibular tenderness on the right, concerning for spreading infection. Will obtain CT for further evaluation, especially considering worsening symptoms despite ABX. CT shows 9 x 11 mm apical and. I still abscess with associated right facial cellulitis with secondary involvement of the right masticator space and portions of the right parotid and submandibular space. Imaging was discussed with attending, Dr. Deretha Emory. Will switch antibiotic to clindamycin. Coupon provided for clindamycin and patient states she can afford this. Patient has an appointment with dentist in 3 weeks- I encouraged her to call and inform them of today's CT results, requesting earlier appointment. A significant amount of time was taken to discuss strict return precautions.  Patient verbally expresses understanding of reasons to return to ED and agrees to do so if needed. All questions answered.  Patient discussed with Dr. Deretha Emory, who agrees with treatment plan.   Blood pressure 134/92, pulse 83, temperature 98.5 F (36.9 C), temperature source Oral, resp. rate 20, height 5\' 8"  (1.727 m), weight 80.7 kg, last menstrual period 05/08/2016, SpO2 100 %.  Final Clinical Impressions(s) / ED Diagnoses   Final diagnoses:  Dental abscess  Cellulitis of other specified site    New Prescriptions Discharge Medication List as of 05/11/2016  3:39 PM    START taking these medications   Details  clindamycin (CLEOCIN) 300 MG capsule Take 1 capsule (300 mg total) by mouth 4 (four) times daily.,  Starting Wed 05/11/2016, Print       I personally performed the services described in this documentation, which was scribed in my presence. The recorded information has been reviewed and is accurate.      [facility] Ward, PA-C 05/11/16 1706    Vanetta Mulders, MD 05/12/16 [phone]

## 2016-05-11 NOTE — Discharge Instructions (Signed)
Please take all of your antibiotics until finished! Call your dentist and let them know you were diagnosed with an apical and periosteal abscess and that you were strongly encouraged to follow up sooner than your scheduled appointment in three weeks.   SEEK MEDICAL CARE IF:  You have increased pain not controlled with medicines.  You have swelling or pain under your tongue You have a fever >101 If you are unable to open your mouth Difficulty breathing or difficulty swallowing You have any additional concerns

## 2016-05-11 NOTE — ED Triage Notes (Signed)
Pt reports swelling to RT side of face. Tere is also a broken tooth on Rt lower side.

## 2016-05-11 NOTE — ED Notes (Signed)
PA at bedside updating pt 

## 2016-05-11 NOTE — ED Notes (Signed)
Pt ambulated to room from waiting room. 

## 2016-05-11 NOTE — ED Notes (Signed)
Patient able to ambulate independently  

## 2017-08-02 ENCOUNTER — Emergency Department (HOSPITAL_COMMUNITY)
Admission: EM | Admit: 2017-08-02 | Discharge: 2017-08-02 | Disposition: A | Discharge status: Home / Self Care | Payer: BLUE CROSS/BLUE SHIELD | Attending: Emergency Medicine | Admitting: Emergency Medicine

## 2017-08-02 ENCOUNTER — Encounter (HOSPITAL_COMMUNITY): Payer: Self-pay | Admitting: *Deleted

## 2017-08-02 DIAGNOSIS — Z79899 Other long term (current) drug therapy: Secondary | ICD-10-CM | POA: Insufficient documentation

## 2017-08-02 DIAGNOSIS — S61219A Laceration without foreign body of unspecified finger without damage to nail, initial encounter: Secondary | ICD-10-CM | POA: Insufficient documentation

## 2017-08-02 DIAGNOSIS — Z23 Encounter for immunization: Secondary | ICD-10-CM | POA: Insufficient documentation

## 2017-08-02 DIAGNOSIS — Y939 Activity, unspecified: Secondary | ICD-10-CM | POA: Insufficient documentation

## 2017-08-02 DIAGNOSIS — W268XXA Contact with other sharp object(s), not elsewhere classified, initial encounter: Secondary | ICD-10-CM | POA: Insufficient documentation

## 2017-08-02 DIAGNOSIS — Y990 Civilian activity done for income or pay: Secondary | ICD-10-CM | POA: Insufficient documentation

## 2017-08-02 DIAGNOSIS — Y929 Unspecified place or not applicable: Secondary | ICD-10-CM | POA: Insufficient documentation

## 2017-08-02 MED ORDER — BACITRACIN ZINC 500 UNIT/GM EX OINT
TOPICAL_OINTMENT | Freq: Two times a day (BID) | CUTANEOUS | Status: DC
  Administered 2017-08-02: 1 via TOPICAL
  Filled 2017-08-02: qty 0.9

## 2017-08-02 MED ORDER — CEPHALEXIN 500 MG PO CAPS: 500.0000 mg | ORAL_CAPSULE | Freq: Four times a day (QID) | ORAL | 0 refills | Status: DC

## 2017-08-02 MED ORDER — IBUPROFEN 200 MG PO TABS
600.0000 mg | ORAL_TABLET | Freq: Once | ORAL | Status: AC
  Administered 2017-08-02: 600 mg via ORAL
  Filled 2017-08-02: qty 3

## 2017-08-02 MED ORDER — TETANUS-DIPHTH-ACELL PERTUSSIS 5-2.5-18.5 LF-MCG/0.5 IM SUSP
0.5000 mL | Freq: Once | INTRAMUSCULAR | Status: AC
  Administered 2017-08-02: 0.5 mL via INTRAMUSCULAR
  Filled 2017-08-02: qty 0.5

## 2017-08-02 NOTE — Discharge Instructions (Signed)
Your cut does not appear to be able to be sutured at this time.  Feel that you would benefit from secondary closure.  Your tetanus shot has been updated.  Will start on antibiotics to take as prescribed to prevent infection.  Use the antibiotic ointment.  Clean the wound and redress every day.  Wear the splints to help healing process.  Follow-up with the hand doctor if your symptoms are not improving.  Return to the ED sooner if you develop any signs of infection.

## 2017-08-02 NOTE — ED Provider Notes (Addendum)
[facility]-EMERGENCY DEPT Provider Note   CSN: [id number] Arrival date & time: 08/02/17  1031     History   Chief Complaint Chief Complaint  Patient presents with  . Extremity Laceration    HPI Robyn Miller is a 21 y.o. female.  HPI 21 year old African-American female with no pertinent past medical history presents to the ED for evaluation of laceration to her left index and middle finger.  The patient states that last night she was at work when she hit the hand on a piece of metal and it slid across her fingers.  The patient states that this happened at approximately 6 PM last night.  States that she poured peroxide on the wound and covered it with Neosporin and bandage.  States that she is unsure of her last tetanus shot.  Patient did not come to the ED last night because she was scared of needles and sutures.  States that she does want to make sure that is okay today.  Patient denies minimal pain.  Has full range of motion of all fingers.  Reports normal sensation. Past Medical History:  Diagnosis Date  . Abscess     There are no active problems to display for this patient.   History reviewed. No pertinent surgical history.  OB History    No data available       Home Medications    Prior to Admission medications   Medication Sig Start Date End Date Taking? Authorizing Provider  cephALEXin (KEFLEX) 500 MG capsule Take 1 capsule (500 mg total) by mouth 4 (four) times daily. 08/02/17   Rise Mu, PA-C  clindamycin (CLEOCIN) 300 MG capsule Take 1 capsule (300 mg total) by mouth 4 (four) times daily. 05/11/16   Ward, Chase Picket, PA-C  ferrous sulfate 325 (65 FE) MG tablet Take 1 tablet (325 mg total) by mouth daily with breakfast. 09/10/15   Armando Reichert, CNM  naproxen (NAPROSYN) 500 MG tablet Take 1 tablet (500 mg total) by mouth 2 (two) times daily. 05/09/16   Felicie Morn, NP  norgestimate-ethinyl estradiol  (ORTHO-CYCLEN,SPRINTEC,PREVIFEM) 0.25-35 MG-MCG tablet Take 1 tablet by mouth daily. 10/01/15   Levie Heritage, DO  penicillin v potassium (VEETID) 500 MG tablet Take 1 tablet (500 mg total) by mouth 3 (three) times daily. 05/09/16   Felicie Morn, NP    Family History No family history on file.  Social History Social History  Substance Use Topics  . Smoking status: Never Smoker  . Smokeless tobacco: Never Used  . Alcohol use Yes     Comment: occasional     Allergies   Patient has no known allergies.   Review of Systems Review of Systems  Constitutional: Negative for fever.  Musculoskeletal: Positive for myalgias.  Skin: Positive for wound.  Neurological: Negative for weakness and numbness.     Physical Exam Updated Vital Signs BP 140/83   Pulse 91   Temp 98.5 F (36.9 C) (Oral)   Resp 16   LMP 07/26/2017   SpO2 100%   Physical Exam  Constitutional: She appears well-developed and well-nourished. No distress.  HENT:  Head: Normocephalic and atraumatic.  Eyes: Right eye exhibits no discharge. Left eye exhibits no discharge. No scleral icterus.  Neck: Normal range of motion.  Pulmonary/Chest: No respiratory distress.  Musculoskeletal: Normal range of motion.  Patient has superficial laceration to the dorsal aspect of the  Left index finger over the DIP.  Bleeding is controlled.  She also  has a 3 cm superficial abrasion to the palmar aspect of the left index finger over the PIP.  Bleeding is controlled.  She does have a skin avulsion laceration to the palmar aspect of the left middle finger over the PIP.  The skin is avulsed and macerated.  Patient has full range of motion of all DIP and PIP joints of the left fingers.  Full range of motion of all MC joints.  Patient has brisk capillary refill.  There is no associated erythema.  Sensation intact in all dermatomes.  Radial pulses 2+ bilaterally.  Neurological: She is alert.  Skin: Skin is warm and dry. Capillary refill  takes less than 2 seconds. No pallor.  Psychiatric: Her behavior is normal. Judgment and thought content normal.  Nursing note and vitals reviewed.          ED Treatments / Results  Labs (all labs ordered are listed, but only abnormal results are displayed) Labs Reviewed - No data to display  EKG  EKG Interpretation None       Radiology No results found.  Procedures Procedures (including critical care time)  Medications Ordered in ED Medications  bacitracin ointment (1 application Topical Given 08/02/17 1453)  Tdap (BOOSTRIX) injection 0.5 mL (0.5 mLs Intramuscular Given 08/02/17 1453)  ibuprofen (ADVIL,MOTRIN) tablet 600 mg (600 mg Oral Given 08/02/17 1511)     Initial Impression / Assessment and Plan / ED Course  I have reviewed the triage vital signs and the nursing notes.  Pertinent labs & imaging results that were available during my care of the patient were reviewed by me and considered in my medical decision making (see chart for details).     Patient presents to the ED for evaluation of laceration to the left index and middle finger that occurred last night at 6 PM at work.  Wound occurred approximately 18 hours ago.  The wound on the index finger is very superficial.  The wound on the ring finger is really more of an avulsion.  There is nothing to approximate therapy.  Given the length of the incident, location and the characteristic do not feel that primary closure is indicated at this time.  Wound was cleaned and dressed with antibiotic ointment and sterile dressing.  Will be placed in a splint to allow for healing.  Will start patient on prophylactic antibiotics to prevent infection given the location of the wound.  Patient is neurovascularly intact.  Full range of motion.  Do not appreciate any foreign bodies.  Without any tendon involvement given the superficial nature of the wound.  Do not feel that imaging is indicated at this time.  Patient tetanus shot  was updated.  Pt is hemodynamically stable, in NAD, & able to ambulate in the ED. Evaluation does not show pathology that would require ongoing emergent intervention or inpatient treatment. I explained the diagnosis to the patient. Pain has been managed & has no complaints prior to dc. Pt is comfortable with above plan and is stable for discharge at this time. All questions were answered prior to disposition. Strict return precautions for f/u to the ED were discussed. Encouraged follow up with PCP. Have given hand follow up with symptom are not improving.   SPLINT APPLICATION Date/Time: 11:36 PM Authorized by: Demetrios LollKenneth Leaphart Consent: Verbal consent obtained. Risks and benefits: risks, benefits and alternatives were discussed Consent given by: patient Splint applied by: orthopedic technician Location details: Left middle and index finger Splint type: Static finger Supplies used: Metal  static finger splint Post-procedure: The splinted body part was neurovascularly unchanged following the procedure. Patient tolerance: Patient tolerated the procedure well with no immediate complications.     Final Clinical Impressions(s) / ED Diagnoses   Final diagnoses:  Laceration of finger of left hand without foreign body without damage to nail, unspecified finger, initial encounter    New Prescriptions New Prescriptions   CEPHALEXIN (KEFLEX) 500 MG CAPSULE    Take 1 capsule (500 mg total) by mouth 4 (four) times daily.     Rise Mu, PA-C 08/02/17 1528    Alvira Monday, MD 08/03/17 1259    Rise Mu, PA-C 08/31/17 2337    Alvira Monday, MD 09/02/17 [phone]

## 2017-08-02 NOTE — ED Triage Notes (Addendum)
Pt has laceration to left index and middle finger since hitting her hand on a metal trailer at work yesterday. Pt poured peroxide on wound and covered with neosporin and bandage. Pt is unsure when she last had tetanus shot.

## 2018-10-01 ENCOUNTER — Encounter: Payer: Self-pay | Admitting: *Deleted

## 2018-10-01 ENCOUNTER — Encounter: Payer: Self-pay | Admitting: Family Medicine

## 2018-10-01 ENCOUNTER — Ambulatory Visit (INDEPENDENT_AMBULATORY_CARE_PROVIDER_SITE_OTHER): Payer: Self-pay | Admitting: *Deleted

## 2018-10-01 VITALS — Wt 173.6 lb

## 2018-10-01 DIAGNOSIS — Z3A08 8 weeks gestation of pregnancy: Secondary | ICD-10-CM

## 2018-10-01 DIAGNOSIS — Z3201 Encounter for pregnancy test, result positive: Secondary | ICD-10-CM

## 2018-10-01 LAB — POCT PREGNANCY, URINE: PREG TEST UR: POSITIVE — AB

## 2018-10-01 NOTE — Progress Notes (Signed)
No pain. Occ dizziness. Hx HTN but not on meds. Positive upt at home and wanted to confirm. LMP 08/03/18. Had some spotting 09/16/18 but nothing since. Pt is not taking any medications currently. [place] [facility] [place]

## 2018-10-03 NOTE — L&D Delivery Note (Addendum)
Delivery Note At 11:17 PM a viable female was delivered via Vaginal, Spontaneous (Presentation: Vertex; LOA).  APGAR: 8, 9; weight 2855g, 6lb 4.7 oz.    Placenta status: Expressed after cord avulsion. Light tension was placed on the cord and placenta was delivering during time of  avulsion. Lattie Haw Leftwich-Kirby expressed the placenta with ringed forceps and her non dominant hand while the patient assisted with valsalva. No manual extraction was needed.  Cord: 3 vessel cord with the following complications: loose nuchal x1 reduced during restitution.   Anesthesia: Epidural  Episiotomy: None Lacerations: 1st degree;Labial Suture Repair: N/A Est. Blood Loss (mL): 450  Mom to postpartum.  Baby to Couplet care / Skin to Skin.  Gerlene Fee, DO Family Medicine Resident, PGY-1 05/04/2019, 11:55 PM  Midwife attestation: I was gloved and present for delivery in its entirety and I agree with the above resident's note.  Fatima Blank, CNM 1:42 AM

## 2018-10-16 NOTE — Progress Notes (Signed)
Chart reviewed for nurse visit. Agree with plan of care.   Bridney Guadarrama Lauren, DO 10/16/2018 2:49 PM  

## 2018-10-23 ENCOUNTER — Ambulatory Visit (INDEPENDENT_AMBULATORY_CARE_PROVIDER_SITE_OTHER): Payer: Medicaid Other | Admitting: Medical

## 2018-10-23 ENCOUNTER — Ambulatory Visit: Payer: Self-pay | Admitting: Clinical

## 2018-10-23 ENCOUNTER — Encounter: Payer: Self-pay | Admitting: Medical

## 2018-10-23 ENCOUNTER — Other Ambulatory Visit (HOSPITAL_COMMUNITY)
Admission: RE | Admit: 2018-10-23 | Discharge: 2018-10-23 | Disposition: A | Discharge status: Home / Self Care | Payer: Medicaid Other | Source: Ambulatory Visit | Attending: Medical | Admitting: Medical

## 2018-10-23 DIAGNOSIS — O0990 Supervision of high risk pregnancy, unspecified, unspecified trimester: Secondary | ICD-10-CM | POA: Diagnosis not present

## 2018-10-23 DIAGNOSIS — O161 Unspecified maternal hypertension, first trimester: Secondary | ICD-10-CM

## 2018-10-23 DIAGNOSIS — O0991 Supervision of high risk pregnancy, unspecified, first trimester: Secondary | ICD-10-CM | POA: Diagnosis not present

## 2018-10-23 DIAGNOSIS — I10 Essential (primary) hypertension: Secondary | ICD-10-CM | POA: Insufficient documentation

## 2018-10-23 DIAGNOSIS — B009 Herpesviral infection, unspecified: Secondary | ICD-10-CM

## 2018-10-23 LAB — POCT URINALYSIS DIP (DEVICE)
BILIRUBIN URINE: NEGATIVE
Glucose, UA: NEGATIVE mg/dL
Hgb urine dipstick: NEGATIVE
Ketones, ur: NEGATIVE mg/dL
Nitrite: NEGATIVE
Protein, ur: 30 mg/dL — AB
Specific Gravity, Urine: 1.015 (ref 1.005–1.030)
Urobilinogen, UA: 0.2 mg/dL (ref 0.0–1.0)
pH: 7 (ref 5.0–8.0)

## 2018-10-23 MED ORDER — LABETALOL HCL 200 MG PO TABS: 200.0000 mg | ORAL_TABLET | Freq: Two times a day (BID) | ORAL | 1 refills | Status: DC

## 2018-10-23 NOTE — BH Specialist Note (Signed)
Integrated Behavioral Health Initial Visit  MRN: [id number] Name: Robyn Miller  Number of Integrated Behavioral Health Clinician visits:: 1/6 Session Start time: 2:51  Session End time: 3:05 Total time: 15 minutes  Type of Service: Integrated Behavioral Health- Individual/Family Interpretor:No. Interpretor Name and Language: n/a   Warm Hand Off Completed.       SUBJECTIVE: Robyn Miller is a 23 y.o. female accompanied by n/a Patient was referred by Vonzella Nipple, PA-C for Initial OB introduction to integrated behavioral health services . Patient reports the following symptoms/concerns: Pt states mild concern about high blood pressure/family history of hypertension; no other concerns at this time. Duration of problem: Current pregnancy; Severity of problem: mild  OBJECTIVE: Mood: Normal and Affect: Appropriate Risk of harm to self or others: No plan to harm self or others  LIFE CONTEXT: Family and Social: - School/Work: - Self-Care: - Life Changes: Current pregnancy   GOALS ADDRESSED: Patient will: 1. Increase knowledge and/or ability of: healthy habits  2. Demonstrate ability to: Increase healthy adjustment to current life circumstances  INTERVENTIONS: Interventions utilized: Psychoeducation and/or Health Education  Standardized Assessments completed: GAD-7 and PHQ 9  ASSESSMENT: Patient currently experiencing Supervision of high risk pregnancy, antepartum   Patient may benefit from Initial OB introduction to integrated behavioral health services .  PLAN: 1. Follow up with behavioral health clinician on : As needed  2. Behavioral recommendations:  -Continue taking prenatal vitamin, as recommended by medical provider -Read educational materials regarding behavioral changes to manage high blood pressure; share with family  3. Referral(s): Integrated [employer] (In Clinic) 4. "From scale of 1-10, how likely are you to follow plan?": 10  Rae Lips, LCSW  Depression screen [facility] 2/9 10/23/2018  Decreased Interest 0  Down, Depressed, Hopeless 0  PHQ - 2 Score 0  Altered sleeping 0  Tired, decreased energy 0  Change in appetite 3  Feeling bad or failure about yourself  0  Trouble concentrating 0  Moving slowly or fidgety/restless 0  Suicidal thoughts 0  PHQ-9 Score 3   GAD 7 : Generalized Anxiety Score 10/23/2018  Nervous, Anxious, on Edge 0  Control/stop worrying 0  Worry too much - different things 1  Trouble relaxing 0  Restless 0  Easily annoyed or irritable 0  Afraid - awful might happen 0  Total GAD 7 Score 1

## 2018-10-23 NOTE — Patient Instructions (Signed)
AREA PEDIATRIC/FAMILY PRACTICE PHYSICIANS  [facility] [address], [address] [place], [place]  [address] Phone - [phone]   Fax - [phone]  [facility] [address] [place], [place] [address] Phone - [phone]   Fax - [phone]  JACK AMOS [address] [place], [place]  [address] Phone - [phone]   Fax - [phone]  [facility] [address] [place], [place]  [address] Phone - [phone]   Fax - [phone]  [facility] [address] [place], [place]  [address] Phone - [phone]   Fax - [phone]  [facility] [address] [place], [place]  [address] Phone - [phone]   Fax - [phone]  [facility] [address] [place], [place]  [address] Phone - [phone]   Fax - [phone]  [facility] [address], [address] [place], [place]  [address] Phone - [phone]   Fax - [phone]  [facility] [address] [place], [place]  [address] Phone - [phone]   Fax - [phone] [facility] [address] [place], [place]  [address] Phone - [phone]   Fax - [phone]  [facility] [address] [place], [place]  [address] Phone - [phone]   Fax - [phone]  [facility] [address] [place], [place]  [address] Phone - [phone]   Fax - [phone]  [facility] [address] [place], [place]  [address] Phone - [phone]   Fax - [phone]  SHILPA GOSRANI [address] [place], [place]  [address] Phone - [phone]  [facility] [address] [place], [place]  [address] Phone - [phone]   Fax - [phone]  [facility] [address] [place], [place]  [address] Phone - [phone]   Fax - [phone]  [facility] [address] [place], [place]  [address] Phone - [phone]   Fax - [phone]  [facility] [address] [place], [place]  [address] Phone - [phone]   Fax - [phone]   [facility] [address] [place], [place]  [address] Phone - [phone]   Fax - [phone]  [facility] [address] [place], [place]  [address] Phone - [phone]   Fax - [phone]  DAVID RUBIN [address] [place], [place]  [address] Phone - [phone]   Fax - [phone]  [facility] [address] [place], [place]  [address] Phone - [phone]   Fax - [phone]  [facility] [address] , [place]  [address] Phone - [phone]   Fax - [phone] [facility] [address] [place], [place]  [address] Phone - [phone]   Fax - [phone]  [facility] [address] [place], [place]  [address] Phone - [phone]   Fax - [phone]  [facility] [address] [place],   [address] Phone - [phone]   Fax - [phone]  [facility] Charlene Flemming MD [address] [place]  [address] Phone [phone]  Fax [phone]  Childbirth Education Options: [facility] Classes:  Childbirth education classes can help you   get ready for a positive parenting experience. You can also meet other expectant parents and get free stuff for your baby. Each class runs for five weeks on the same night and costs $45 for the mother-to-be and her support person. Medicaid covers the cost if you are eligible. Call [phone] to register. [facility] Childbirth Education:  [phone] or [phone] or  [email or web address]  Baby & Me Class: Discuss newborn & infant parenting and family adjustment issues with other new mothers in a relaxed environment. Each week brings a new speaker or baby-centered activity. We encourage new mothers to join us every Thursday at 11:00am. Babies birth until crawling. No registration or fee. Daddy Boot Camp: This course offers Dads-to-be the tools and knowledge needed to feel confident on their journey to becoming new fathers. Experienced dads, who have been trained as coaches, teach dads-to-be how to hold, comfort, diaper, swaddle and play with their infant while being able to support the new mom as well. A class for men taught by men. $25/dad Big Brother/Big Sister: Let your children share in the joy of a new brother or sister in this special class designed just for them. Class includes discussion about how families care for babies: swaddling, holding, diapering, safety as well as how they can be helpful in their new role. This class is designed for children ages 2 to 6, but any age is welcome. Please register each child individually. $5/child  Mom Talk: This mom-led group offers support and connection to mothers as they journey through the adjustments and struggles of that sometimes overwhelming first year after the birth of a child. Tuesdays at 10:00am and Thursdays at 6:00pm. Babies welcome. No registration or fee. Breastfeeding Support Group: This group is a mother-to-mother support circle where moms have the opportunity to share their breastfeeding experiences. A Lactation Consultant is present for questions and concerns. Meets each Tuesday at 11:00am. No fee or registration. Breastfeeding Your Baby: Learn what to expect in the first days of breastfeeding your newborn.  This class will help you feel more confident with the skills needed to begin your breastfeeding experience. Many new mothers are concerned about breastfeeding after leaving the hospital. This class  will also address the most common fears and challenges about breastfeeding during the first few weeks, months and beyond. (call for fee) Comfort Techniques and Tour: This 2 hour interactive class will provide you the opportunity to learn & practice hands-on techniques that can help relieve some of the discomfort of labor and encourage your baby to rotate toward the best position for birth. You and your partner will be able to try a variety of labor positions with birth balls and rebozos as well as practice breathing, relaxation, and visualization techniques. A tour of the [facility] is included with this class. $20 per registrant and support person Childbirth Class- Weekend Option: This class is a Weekend version of our Birth & Baby series. It is designed for parents who have a difficult time fitting several weeks of classes into their schedule. It covers the care of your newborn and the basics of labor and childbirth. It also includes a Maternity Care Center Tour of [facility] and lunch. The class is held two consecutive days: beginning on Friday evening from 6:30 - 8:30 p.m. and the next day, Saturday from 9 a.m. - 4 p.m. (call for fee) Waterbirth Class: Interested in a waterbirth?  This informational class will help you discover whether waterbirth is the right fit for you.   Education about waterbirth itself, supplies you would need and how to assemble your support team is what you can expect from this class. Some obstetrical practices require this class in order to pursue a waterbirth. (Not all obstetrical practices offer waterbirth-check with your healthcare provider.) Register only the expectant mom, but you are encouraged to bring your partner to class! Required if planning waterbirth, no fee. Infant/Child CPR: Parents, grandparents, babysitters, and friends learn Cardio-Pulmonary Resuscitation skills for infants and children. You will also learn how to treat both conscious  and unconscious choking in infants and children. This Family & Friends program does not offer certification. Register each participant individually to ensure that enough mannequins are available. (Call for fee) Grandparent Love: Expecting a grandbaby? This class is for you! Learn about the latest infant care and safety recommendations and ways to support your own child as he or she transitions into the parenting role. Taught by Registered Nurses who are childbirth instructors, but most importantly...they are grandmothers too! $10/person. Childbirth Class- Natural Childbirth: This series of 5 weekly classes is for expectant parents who want to learn and practice natural methods of coping with the process of labor and childbirth. Relaxation, breathing, massage, visualization, role of the partner, and helpful positioning are highlighted. Participants learn how to be confident in their body's ability to give birth. This class will empower and help parents make informed decisions about their own care. Includes discussion that will help new parents transition into the immediate postpartum period. [facility] is included. We suggest taking this class between 25-32 weeks, but it's only a recommendation. $75 per registrant and one support person or $30 Medicaid. Childbirth Class- 3 week Series: This option of 3 weekly classes helps you and your labor partner prepare for childbirth. Newborn care, labor & birth, cesarean birth, pain management, and comfort techniques are discussed and a [place] [facility] is included. The class meets at the same time, on the same day of the week for 3 consecutive weeks beginning with the starting date you choose. $60 for registrant and one support person.  Marvelous Multiples: Expecting twins, triplets, or more? This class covers the differences in labor, birth, parenting, and breastfeeding issues that face multiples' parents.  NICU tour is included. Led by a Certified Childbirth Educator who is the mother of twins. No fee. Caring for Baby: This class is for expectant and adoptive parents who want to learn and practice the most up-to-date newborn care for their babies. Focus is on birth through the first six weeks of life. Topics include feeding, bathing, diapering, crying, umbilical cord care, circumcision care and safe sleep. Parents learn to recognize symptoms of illness and when to call the pediatrician. Register only the mom-to-be and your partner or support person can plan to come with you! $10 per registrant and support person Childbirth Class- online option: This online class offers you the freedom to complete a Birth and Baby series in the comfort of your own home. The flexibility of this option allows you to review sections at your own pace, at times convenient to you and your support people. It includes additional video information, animations, quizzes, and extended activities. Get organized with helpful eClass tools, checklists, and trackers. Once you register online for the class, you will receive an email within a few days to accept the invitation and begin the class when the time is right for you. The content will be available to you for 60 days. $  60 for 60 days of online access for you and your support people.  Local Doulas: Natural Baby Doulas [email or web address] Tel: [phone] [email or web address] [facility] [phone] [email or web address] The Labor Ladies  (also do waterbirth tub rental) [phone] [email or web address] [facility] [phone] [email or web address] Sacred Rhythms  [phone] [email or web address] [place] [facility] [employer] (PADA) [email or web address] http://www.padanc.org/index.htm [place] [facility]   [email or web address] Considering Waterbirth? Guide for patients at [facility]  Why consider waterbirth?  . Gentle birth for babies . Less pain medicine used in labor . May allow for passive descent/less pushing . May reduce perineal tears  . More mobility and instinctive maternal position changes . Increased maternal relaxation . Reduced blood pressure in labor  Is waterbirth safe? What are the risks of infection, drowning or other complications?  . Infection: o Very low risk (3.7 % for tub vs 4.8% for bed) o 7 in 8000 waterbirths with documented infection o Poorly cleaned equipment most common cause o Slightly lower group B strep transmission rate  . Drowning o Maternal:  - Very low risk   - Related to seizures or fainting o Newborn:  - Very low risk. No evidence of increased risk of respiratory problems in multiple large studies - Physiological protection from breathing under water - Avoid underwater birth if there are any fetal complications - Once baby's head is out of the water, keep it out.  . Birth complication o Some reports of cord trauma, but risk decreased by bringing baby to surface gradually o No evidence of increased risk of shoulder dystocia. Mothers can usually change positions faster in water than in a bed, possibly aiding the maneuvers to free the shoulder.   You must attend a Waterbirth class at [facility]  3rd Wednesday of every month from 7-9pm  Free  Register by calling [phone] or online at [email or web address]  Bring us the certificate from the class to your prenatal appointment  Meet with a midwife at 36 weeks to see if you can still plan a waterbirth and to sign the consent.   Purchase or rent the following supplies:   Water Birth Pool (Birth Pool in a Box or [place] for instance)  (Tubs start ~$125)  Single-use disposable tub liner designed for your brand of tub  New garden hose labeled  "lead-free", "suitable for drinking water",  Electric drain pump to remove water (We recommend 792 gallon per hour or greater pump.)   Separate garden hose to remove the dirty water  Fish net  Bathing suit top (optional)  Long-handled mirror (optional)  Places to purchase or rent supplies  Yourwaterbirth.com for tub purchases and supplies  Waterbirthsolutions.com for tub purchases and supplies  The Labor Ladies (www.thelaborladies.com) $275 for tub rental/set-up & take down/kit   [facility] (http://www.padanc.org/MeetUs.htm) Information regarding doulas (labor support) who provide pool rentals  Our practice has a Birth Pool in a Box tub at the hospital that you may borrow on a first-come-first-served basis. It is your responsibility to to set up, clean and break down the tub. We cannot guarantee the availability of this tub in advance. You are responsible for bringing all accessories listed above. If you do not have all necessary supplies you cannot have a waterbirth.    Things that would prevent you from having a waterbirth:  Premature, <37wks  Previous cesarean birth  Presence of thick meconium-stained fluid  Multiple gestation (Twins, triplets, etc.)  Uncontrolled diabetes or gestational diabetes requiring medication  Hypertension requiring medication   or diagnosis of pre-eclampsia  Heavy vaginal bleeding  Non-reassuring fetal heart rate  Active infection (MRSA, etc.). Group B Strep is NOT a contraindication for  waterbirth.  If your labor has to be induced and induction method requires continuous  monitoring of the baby's heart rate  Other risks/issues identified by your obstetrical provider  Please remember that birth is unpredictable. Under certain unforeseeable circumstances your provider may advise against giving birth in the tub. These decisions will be made on a case-by-case basis and with the safety of you and your baby as our highest  priority.      

## 2018-10-23 NOTE — Progress Notes (Signed)
Anatomy ultrasound scheduled for March 6th @ 0830.

## 2018-10-23 NOTE — Progress Notes (Signed)
   PRENATAL VISIT NOTE  Subjective:  Robyn Miller is a 23 y.o. G2P0010 at [redacted]w[redacted]d being seen today for her first prenatal care visit.  She is currently monitored for the following issues for this high-risk pregnancy and has Supervision of high risk pregnancy, antepartum; Elevated blood pressure affecting pregnancy in first trimester, antepartum; and HSV infection on their problem list.  Patient reports no complaints.  Contractions: Not present. Vag. Bleeding: None.  Movement: Absent. Denies leaking of fluid.   The following portions of the patient's history were reviewed and updated as appropriate: allergies, current medications, past family history, past medical history, past social history, past surgical history and problem list. Problem list updated.  Objective:   Vitals:   10/23/18 1331 10/23/18 1342  BP: (!) 170/90 (!) 141/92  Pulse: (!) 133 (!) 113  Weight: 169 lb 12.8 oz (77 kg)     Fetal Status: Fetal Heart Rate (bpm): 164   Movement: Absent     General:  Alert, oriented and cooperative. Patient is in no acute distress.  Skin: Skin is warm and dry. No rash noted.   Cardiovascular: Normal heart rate and rhythm noted  Respiratory: Normal respiratory effort, no problems with respiration noted. Clear to auscultation.   Abdomen: Soft, gravid, appropriate for gestational age. Normal bowel sounds. Non tender  Pain/Pressure: Absent     Pelvic: Cervical exam performed       Normal cervix contour. No lesions. Mild friability. Scant bleeding after pap. Normal vaginal discharge.  Breast: symmetric, no masses. No nipples drainage or bleeding.  Extremities: Normal range of motion.  Edema: None  Mental Status: Normal mood and affect. Normal behavior. Normal judgment and thought content.   Assessment and Plan:  Pregnancy: G2P0010 at [redacted]w[redacted]d  1. Supervision of high risk pregnancy, antepartum - Discussed cadence of OB visits, advised of team based approach - Culture, OB Urine - Obstetric  Panel, Including HIV - Cytology - PAP( [place]) - CHL AMB BABYSCRIPTS OPT IN - Comprehensive metabolic panel - Protein / creatinine ratio, urine - TSH - Genetic Screening - US MFM OB DETAIL +14 WK; Future  2. Elevated blood pressure affecting pregnancy in first trimester, antepartum - Discussed with Dr. Alysia Penna. Start on Labetalol and return in 2 weeks for BP check - labetalol (NORMODYNE) 200 MG tablet; Take 1 tablet (200 mg total) by mouth 2 (two) times daily.  Dispense: 60 tablet; Refill: 1 - Advised to start ASA at 12 weeks.   3. HSV infection - Will need suppressive therapy at 36 weeks  Preterm labor/first trimester warning symptoms and general obstetric precautions including but not limited to vaginal bleeding, contractions, leaking of fluid and fetal movement were reviewed in detail with the patient. Please refer to After Visit Summary for other counseling recommendations.  Return in about 4 weeks (around 11/20/2018) for [facility].  Future Appointments  Date Time Provider Department Center  12/07/2018  8:30 AM [facility] [place] 5 [facility]    Vonzella Nipple, PA-C

## 2018-10-24 LAB — OBSTETRIC PANEL, INCLUDING HIV
Antibody Screen: NEGATIVE
BASOS ABS: 0 10*3/uL (ref 0.0–0.2)
Basos: 0 %
EOS (ABSOLUTE): 0.1 10*3/uL (ref 0.0–0.4)
Eos: 0 %
HIV SCREEN 4TH GENERATION: NONREACTIVE
Hematocrit: 34.3 % (ref 34.0–46.6)
Hemoglobin: 10.8 g/dL — ABNORMAL LOW (ref 11.1–15.9)
Hepatitis B Surface Ag: NEGATIVE
Immature Grans (Abs): 0.1 10*3/uL (ref 0.0–0.1)
Immature Granulocytes: 1 %
LYMPHS ABS: 1.9 10*3/uL (ref 0.7–3.1)
Lymphs: 17 %
MCH: 24.2 pg — AB (ref 26.6–33.0)
MCHC: 31.5 g/dL (ref 31.5–35.7)
MCV: 77 fL — ABNORMAL LOW (ref 79–97)
Monocytes Absolute: 0.6 10*3/uL (ref 0.1–0.9)
Monocytes: 5 %
NEUTROS ABS: 8.5 10*3/uL — AB (ref 1.4–7.0)
Neutrophils: 77 %
PLATELETS: 366 10*3/uL (ref 150–450)
RBC: 4.46 x10E6/uL (ref 3.77–5.28)
RDW: 16.2 % — AB (ref 11.7–15.4)
RPR Ser Ql: NONREACTIVE
Rh Factor: POSITIVE
Rubella Antibodies, IGG: 4.16 index (ref >0.99)
WBC: 11.2 10*3/uL — AB (ref 3.4–10.8)

## 2018-10-24 LAB — COMPREHENSIVE METABOLIC PANEL
ALT: 14 IU/L (ref 0–32)
AST: 16 IU/L (ref 0–40)
Albumin/Globulin Ratio: 1.1 — ABNORMAL LOW (ref 1.2–2.2)
Albumin: 4.1 g/dL (ref 3.9–5.0)
Alkaline Phosphatase: 84 IU/L (ref 39–117)
BUN/Creatinine Ratio: 7 — ABNORMAL LOW (ref 9–23)
BUN: 4 mg/dL — AB (ref 6–20)
Bilirubin Total: <0.2 mg/dL (ref 0.0–1.2)
CO2: 20 mmol/L (ref 20–29)
Calcium: 9.8 mg/dL (ref 8.7–10.2)
Chloride: 97 mmol/L (ref 96–106)
Creatinine, Ser: 0.59 mg/dL (ref 0.57–1.00)
GFR calc Af Amer: 147 mL/min/{1.73_m2} (ref >59)
GFR, EST NON AFRICAN AMERICAN: 128 mL/min/{1.73_m2} (ref >59)
Globulin, Total: 3.6 g/dL (ref 1.5–4.5)
Glucose: 83 mg/dL (ref 65–99)
Potassium: 3.9 mmol/L (ref 3.5–5.2)
Sodium: 135 mmol/L (ref 134–144)
Total Protein: 7.7 g/dL (ref 6.0–8.5)

## 2018-10-24 LAB — PROTEIN / CREATININE RATIO, URINE
Creatinine, Urine: 226.9 mg/dL
Protein, Ur: 44 mg/dL
Protein/Creat Ratio: 194 mg/g creat (ref 0–200)

## 2018-10-24 LAB — TSH: TSH: 0.173 u[IU]/mL — AB (ref 0.450–4.500)

## 2018-10-25 LAB — URINE CULTURE, OB REFLEX

## 2018-10-25 LAB — CULTURE, OB URINE

## 2018-10-26 LAB — CYTOLOGY - PAP
Chlamydia: NEGATIVE
Diagnosis: NEGATIVE
Neisseria Gonorrhea: POSITIVE — AB

## 2018-10-27 ENCOUNTER — Encounter: Payer: Self-pay | Admitting: Medical

## 2018-10-27 DIAGNOSIS — O98211 Gonorrhea complicating pregnancy, first trimester: Secondary | ICD-10-CM | POA: Insufficient documentation

## 2018-10-29 ENCOUNTER — Ambulatory Visit (INDEPENDENT_AMBULATORY_CARE_PROVIDER_SITE_OTHER): Payer: Medicaid Other | Admitting: *Deleted

## 2018-10-29 ENCOUNTER — Telehealth: Payer: Self-pay | Admitting: *Deleted

## 2018-10-29 DIAGNOSIS — A549 Gonococcal infection, unspecified: Secondary | ICD-10-CM

## 2018-10-29 DIAGNOSIS — O98212 Gonorrhea complicating pregnancy, second trimester: Secondary | ICD-10-CM

## 2018-10-29 MED ORDER — CEFTRIAXONE SODIUM 250 MG IJ SOLR
250.0000 mg | Freq: Once | INTRAMUSCULAR | Status: AC
  Administered 2018-10-29: 250 mg via INTRAMUSCULAR

## 2018-10-29 MED ORDER — AZITHROMYCIN 250 MG PO TABS
250.0000 mg | ORAL_TABLET | Freq: Once | ORAL | Status: AC
  Administered 2018-10-29: 250 mg via ORAL

## 2018-10-29 NOTE — Telephone Encounter (Signed)
-----   Message from Marny Lowenstein, PA-C sent at 10/27/2018  1:05 PM EST ----- Please call patient about +GC and have her come in for treatment ASAP.   Thanks! Raynelle Fanning

## 2018-10-29 NOTE — Progress Notes (Signed)
I agree with the nurses note and documentation.   Duane Lope, NP 10/29/2018 6:59 PM

## 2018-10-29 NOTE — Telephone Encounter (Signed)
Called pt to inform her that she tested positive for gonorrhea.  Explained that pt would need to come in to the office to receive treatment and that her partner would need to be treated as well. Pt verbalized understanding and reports she will be coming to the after hours clinic today, 10/29/18, for treatment.   STD card completed.

## 2018-10-29 NOTE — Progress Notes (Signed)
PT here for gonorrhea tx. Given Zithromax 1000mg  po and Rocephin 250mg  IM. Pt tol well. Understands partner needs to be treated and no sex for 2wks after he is treated. Pt has appt next wk for prenatal appt.

## 2018-10-31 ENCOUNTER — Ambulatory Visit (INDEPENDENT_AMBULATORY_CARE_PROVIDER_SITE_OTHER): Payer: Medicaid Other

## 2018-10-31 ENCOUNTER — Other Ambulatory Visit (HOSPITAL_COMMUNITY)
Admission: RE | Admit: 2018-10-31 | Discharge: 2018-10-31 | Disposition: A | Discharge status: Home / Self Care | Payer: Medicaid Other | Source: Ambulatory Visit | Attending: Family Medicine | Admitting: Family Medicine

## 2018-10-31 DIAGNOSIS — N898 Other specified noninflammatory disorders of vagina: Secondary | ICD-10-CM | POA: Diagnosis not present

## 2018-10-31 MED ORDER — TERCONAZOLE 0.4 % VA CREA: 1.0000 | TOPICAL_CREAM | Freq: Every day | VAGINAL | 0 refills | Status: DC

## 2018-10-31 NOTE — Progress Notes (Signed)
Pt here today with c/o vaginal itching.  Pt explained on how to obtain a self swab.  Pt requested to have tx sent to her [facility] on [address]  I informed pt that I can send in Terazol cream for her per protocol and if she any other tx then we will give her a call.  Pt stated understanding with no further questions.   Addison Naegeli, RN

## 2018-11-02 ENCOUNTER — Other Ambulatory Visit: Payer: Self-pay | Admitting: Family Medicine

## 2018-11-02 LAB — CERVICOVAGINAL ANCILLARY ONLY
Bacterial vaginitis: POSITIVE — AB
Candida vaginitis: POSITIVE — AB
Chlamydia: NEGATIVE
Neisseria Gonorrhea: POSITIVE — AB
Trichomonas: NEGATIVE

## 2018-11-05 ENCOUNTER — Encounter: Payer: Self-pay | Admitting: *Deleted

## 2018-11-06 ENCOUNTER — Telehealth: Payer: Self-pay

## 2018-11-06 ENCOUNTER — Telehealth: Payer: Self-pay | Admitting: General Practice

## 2018-11-06 ENCOUNTER — Ambulatory Visit (INDEPENDENT_AMBULATORY_CARE_PROVIDER_SITE_OTHER): Payer: Medicaid Other | Admitting: General Practice

## 2018-11-06 VITALS — BP 127/77 | HR 85 | Ht 67.0 in | Wt 170.0 lb

## 2018-11-06 DIAGNOSIS — Z0130 Encounter for examination of blood pressure without abnormal findings: Secondary | ICD-10-CM

## 2018-11-06 NOTE — Progress Notes (Signed)
Chart reviewed for nurse visit. Agree with plan of care.   Kooistra, Kathryn Lorraine, CNM 11/06/2018 12:08 PM   

## 2018-11-06 NOTE — Progress Notes (Signed)
Patient presents to office today for BP check following recent new OB appt where labetalol was prescribed. Patient reports taking medication BID. Patient denies headaches or blurry vision but has had some dizziness. BP is WNL today, patient will follow up at next schedule office visit.  Patient asked for gender results to be given in an envelope. Told patient we do not have her results today & a form would need to be sent in. Patient was very upset and states she was told explicitly they would be back by her appt today. Told patient the form has been sent in but it usually takes 24-48 hours. Patient states she had family drive in today just for this and plans made for the day based off this. Apologized to patient for the confusion. Told patient I would contact our representative to see if it could be expedited. Patient left office.   Chase Caller RN BSN 11/06/18

## 2018-11-06 NOTE — Telephone Encounter (Signed)
Called pt to make sure rcvd My Chart message reg test results, Pt states was in lobby in the Clinic to get Tx.for Gonorrhea.

## 2018-11-06 NOTE — Telephone Encounter (Signed)
Called patient & informed her that Robyn Miller would be faxing the results over in the next 1-2 hours. Patient verbalized understanding and requests we call her mother [name] at [phone] with the gender results. Told patient I would. Patient had no questions.  Called patient's mother & informed her of gender results. She verbalized understanding & had no questions.

## 2018-11-06 NOTE — Telephone Encounter (Signed)
-----   Message from Tamera Stands, DO sent at 11/02/2018  4:02 PM EST ----- MyChart message sent stating positive for gonorrhea and needing to return to clinic. Please call patient to confirm receipt of message and schedule nurse visit for ceftriaxone and azithromycin. Already received treatment for yeast. Does not need treatment for BV. -LSW

## 2018-11-07 NOTE — Telephone Encounter (Signed)
Called pt to clarify test results of +gonorrhea from 1/29. Pt had received Rocephin and Azithromycin per protocol on 1/27 so therefore the test result on 1/29 was not a new infection. The antibiotics had not fully cleared the infection yet as this takes 1-2 weeks. She will have a test to confirm complete resolution of the infection at next visit on 2/19. Pt voiced understanding and appreciation for the clarification.

## 2018-11-19 ENCOUNTER — Encounter: Payer: Self-pay | Admitting: *Deleted

## 2018-11-21 ENCOUNTER — Other Ambulatory Visit (HOSPITAL_COMMUNITY)
Admission: RE | Admit: 2018-11-21 | Discharge: 2018-11-21 | Disposition: A | Discharge status: Home / Self Care | Payer: Medicaid Other | Source: Ambulatory Visit | Attending: Obstetrics and Gynecology | Admitting: Obstetrics and Gynecology

## 2018-11-21 ENCOUNTER — Ambulatory Visit (INDEPENDENT_AMBULATORY_CARE_PROVIDER_SITE_OTHER): Payer: Medicaid Other | Admitting: Obstetrics and Gynecology

## 2018-11-21 ENCOUNTER — Encounter: Payer: Self-pay | Admitting: Obstetrics and Gynecology

## 2018-11-21 VITALS — BP 141/90 | HR 90 | Wt 173.0 lb

## 2018-11-21 DIAGNOSIS — O0990 Supervision of high risk pregnancy, unspecified, unspecified trimester: Secondary | ICD-10-CM | POA: Diagnosis not present

## 2018-11-21 DIAGNOSIS — B009 Herpesviral infection, unspecified: Secondary | ICD-10-CM

## 2018-11-21 DIAGNOSIS — Z3A15 15 weeks gestation of pregnancy: Secondary | ICD-10-CM

## 2018-11-21 DIAGNOSIS — O161 Unspecified maternal hypertension, first trimester: Secondary | ICD-10-CM

## 2018-11-21 DIAGNOSIS — O0991 Supervision of high risk pregnancy, unspecified, first trimester: Secondary | ICD-10-CM

## 2018-11-21 DIAGNOSIS — O98211 Gonorrhea complicating pregnancy, first trimester: Secondary | ICD-10-CM

## 2018-11-21 LAB — POCT URINALYSIS DIP (DEVICE)
Bilirubin Urine: NEGATIVE
Glucose, UA: NEGATIVE mg/dL
Hgb urine dipstick: NEGATIVE
KETONES UR: NEGATIVE mg/dL
Nitrite: NEGATIVE
Protein, ur: NEGATIVE mg/dL
Specific Gravity, Urine: 1.02 (ref 1.005–1.030)
Urobilinogen, UA: 0.2 mg/dL (ref 0.0–1.0)
pH: 7 (ref 5.0–8.0)

## 2018-11-21 NOTE — Progress Notes (Signed)
   PRENATAL VISIT NOTE  Subjective:  Robyn Miller is a 23 y.o. G2P0010 at [redacted]w[redacted]d being seen today for ongoing prenatal care.  She is currently monitored for the following issues for this low-risk pregnancy and has Supervision of high risk pregnancy, antepartum; Elevated blood pressure affecting pregnancy in first trimester, antepartum; HSV infection; and Gonorrhea affecting pregnancy, antepartum, first trimester on their problem list.  Patient reports no complaints.  Contractions: Not present. Vag. Bleeding: None.  Movement: Absent. Denies leaking of fluid.   The following portions of the patient's history were reviewed and updated as appropriate: allergies, current medications, past family history, past medical history, past social history, past surgical history and problem list. Problem list updated.  Objective:   Vitals:   11/21/18 1113  BP: (!) 141/90  Pulse: 90  Weight: 173 lb (78.5 kg)    Fetal Status: Fetal Heart Rate (bpm): 157   Movement: Absent     General:  Alert, oriented and cooperative. Patient is in no acute distress.  Skin: Skin is warm and dry. No rash noted.   Cardiovascular: Normal heart rate noted  Respiratory: Normal respiratory effort, no problems with respiration noted  Abdomen: Soft, gravid, appropriate for gestational age.  Pain/Pressure: Present     Pelvic: Cervical exam deferred        Extremities: Normal range of motion.  Edema: None  Mental Status: Normal mood and affect. Normal behavior. Normal judgment and thought content.   Assessment and Plan:  Pregnancy: G2P0010 at [redacted]w[redacted]d  1. Supervision of high risk pregnancy, antepartum Patient is doing well without complaints TOC today AFP today Patient scheduled for anatomy ultrasound  2. CHTN affecting pregnancy in first trimester, antepartum Continue labetalol and ASA  3. HSV infection Prophylaxis in third trimester  Preterm labor symptoms and general obstetric precautions including but not limited  to vaginal bleeding, contractions, leaking of fluid and fetal movement were reviewed in detail with the patient. Please refer to After Visit Summary for other counseling recommendations.  Return in about 4 weeks (around 12/19/2018) for ROB.  Future Appointments  Date Time Provider Department Center  12/07/2018  8:30 AM WH-MFC [place] 5 WH-MFCUS MFC-US    Catalina Antigua, MD

## 2018-11-22 LAB — CERVICOVAGINAL ANCILLARY ONLY
Bacterial vaginitis: POSITIVE — AB
Candida vaginitis: POSITIVE — AB
Chlamydia: NEGATIVE
Neisseria Gonorrhea: NEGATIVE
Trichomonas: NEGATIVE

## 2018-11-23 LAB — AFP, SERUM, OPEN SPINA BIFIDA
AFP MoM: 1.05
AFP Value: 32.5 ng/mL
Gest. Age on Collection Date: 15.5 weeks
Maternal Age At EDD: 25.5 yr
OSBR Risk 1 IN: 10000
Test Results:: NEGATIVE
Weight: 173 [lb_av]

## 2018-11-23 MED ORDER — TERCONAZOLE 0.8 % VA CREA: 1.0000 | TOPICAL_CREAM | Freq: Every day | VAGINAL | 0 refills | Status: DC

## 2018-11-23 MED ORDER — METRONIDAZOLE 500 MG PO TABS: 500.0000 mg | ORAL_TABLET | Freq: Two times a day (BID) | ORAL | 0 refills | Status: DC

## 2018-11-23 NOTE — Addendum Note (Signed)
Addended by: Catalina Antigua on: 11/23/2018 12:36 AM   Modules accepted: Orders

## 2018-12-05 ENCOUNTER — Other Ambulatory Visit (HOSPITAL_COMMUNITY)
Admission: RE | Admit: 2018-12-05 | Discharge: 2018-12-05 | Disposition: A | Discharge status: Home / Self Care | Payer: Medicaid Other | Source: Ambulatory Visit | Attending: Family Medicine | Admitting: Family Medicine

## 2018-12-05 ENCOUNTER — Ambulatory Visit (INDEPENDENT_AMBULATORY_CARE_PROVIDER_SITE_OTHER): Payer: Medicaid Other

## 2018-12-05 VITALS — Wt 174.0 lb

## 2018-12-05 DIAGNOSIS — O26891 Other specified pregnancy related conditions, first trimester: Secondary | ICD-10-CM | POA: Diagnosis not present

## 2018-12-05 DIAGNOSIS — N898 Other specified noninflammatory disorders of vagina: Secondary | ICD-10-CM | POA: Insufficient documentation

## 2018-12-05 NOTE — Addendum Note (Signed)
Addended by: Ernestina Patches on: 12/05/2018 05:46 PM   Modules accepted: Orders

## 2018-12-07 ENCOUNTER — Ambulatory Visit (HOSPITAL_COMMUNITY): Payer: Medicaid Other | Admitting: *Deleted

## 2018-12-07 ENCOUNTER — Ambulatory Visit (HOSPITAL_COMMUNITY)
Admission: RE | Admit: 2018-12-07 | Discharge: 2018-12-07 | Disposition: A | Discharge status: Home / Self Care | Payer: Medicaid Other | Source: Ambulatory Visit | Attending: Medical | Admitting: Medical

## 2018-12-07 ENCOUNTER — Other Ambulatory Visit (HOSPITAL_COMMUNITY): Payer: Self-pay | Admitting: *Deleted

## 2018-12-07 ENCOUNTER — Encounter (HOSPITAL_COMMUNITY): Payer: Self-pay

## 2018-12-07 VITALS — BP 134/82 | HR 86 | Wt 175.6 lb

## 2018-12-07 DIAGNOSIS — Z3A17 17 weeks gestation of pregnancy: Secondary | ICD-10-CM

## 2018-12-07 DIAGNOSIS — O10012 Pre-existing essential hypertension complicating pregnancy, second trimester: Secondary | ICD-10-CM

## 2018-12-07 DIAGNOSIS — O10919 Unspecified pre-existing hypertension complicating pregnancy, unspecified trimester: Secondary | ICD-10-CM

## 2018-12-07 DIAGNOSIS — O0990 Supervision of high risk pregnancy, unspecified, unspecified trimester: Secondary | ICD-10-CM | POA: Insufficient documentation

## 2018-12-07 DIAGNOSIS — Z363 Encounter for antenatal screening for malformations: Secondary | ICD-10-CM | POA: Diagnosis not present

## 2018-12-07 LAB — CERVICOVAGINAL ANCILLARY ONLY
Bacterial vaginitis: POSITIVE — AB
Candida vaginitis: POSITIVE — AB
Chlamydia: NEGATIVE
Neisseria Gonorrhea: NEGATIVE
Trichomonas: NEGATIVE

## 2018-12-07 NOTE — Progress Notes (Unsigned)
.  fm

## 2018-12-08 MED ORDER — TERCONAZOLE 0.8 % VA CREA: 1.0000 | TOPICAL_CREAM | Freq: Every day | VAGINAL | 0 refills | Status: AC

## 2018-12-08 MED ORDER — METRONIDAZOLE 500 MG PO TABS: 500.0000 mg | ORAL_TABLET | Freq: Two times a day (BID) | ORAL | 0 refills | Status: AC

## 2018-12-08 NOTE — Addendum Note (Signed)
Addended by: Reva Bores on: 12/08/2018 09:28 AM   Modules accepted: Orders

## 2018-12-17 ENCOUNTER — Other Ambulatory Visit: Payer: Self-pay | Admitting: Medical

## 2018-12-17 DIAGNOSIS — O161 Unspecified maternal hypertension, first trimester: Secondary | ICD-10-CM

## 2018-12-18 ENCOUNTER — Telehealth: Payer: Self-pay | Admitting: Student

## 2018-12-18 NOTE — Telephone Encounter (Signed)
Called the patient to inform of clinic restrictions due to the Coronavirus. She understands and agrees.

## 2018-12-19 ENCOUNTER — Encounter: Payer: Self-pay | Admitting: General Practice

## 2018-12-19 ENCOUNTER — Encounter: Payer: Self-pay | Admitting: Family Medicine

## 2018-12-19 ENCOUNTER — Other Ambulatory Visit: Payer: Self-pay

## 2018-12-19 ENCOUNTER — Ambulatory Visit (INDEPENDENT_AMBULATORY_CARE_PROVIDER_SITE_OTHER): Payer: Medicaid Other | Admitting: Family Medicine

## 2018-12-19 VITALS — BP 147/81 | HR 105 | Wt 180.7 lb

## 2018-12-19 DIAGNOSIS — O0990 Supervision of high risk pregnancy, unspecified, unspecified trimester: Secondary | ICD-10-CM

## 2018-12-19 DIAGNOSIS — O0991 Supervision of high risk pregnancy, unspecified, first trimester: Secondary | ICD-10-CM

## 2018-12-19 DIAGNOSIS — O161 Unspecified maternal hypertension, first trimester: Secondary | ICD-10-CM

## 2018-12-19 DIAGNOSIS — Z3A18 18 weeks gestation of pregnancy: Secondary | ICD-10-CM

## 2018-12-19 LAB — POCT URINALYSIS DIP (DEVICE)
Bilirubin Urine: NEGATIVE
Glucose, UA: NEGATIVE mg/dL
Hgb urine dipstick: NEGATIVE
Ketones, ur: NEGATIVE mg/dL
Nitrite: NEGATIVE
Protein, ur: NEGATIVE mg/dL
SPECIFIC GRAVITY, URINE: 1.025 (ref 1.005–1.030)
Urobilinogen, UA: 0.2 mg/dL (ref 0.0–1.0)
pH: 6.5 (ref 5.0–8.0)

## 2018-12-19 NOTE — Patient Instructions (Addendum)

## 2018-12-19 NOTE — Addendum Note (Signed)
Addended by: Kathee Delton on: 12/19/2018 12:01 PM   Modules accepted: Orders

## 2018-12-19 NOTE — Progress Notes (Addendum)
   PRENATAL VISIT NOTE  Subjective:  Robyn Miller is a 23 y.o. G2P0010 at [redacted]w[redacted]d being seen today for ongoing prenatal care.  She is currently monitored for the following issues for this low-risk pregnancy and has Supervision of high risk pregnancy, antepartum; Elevated blood pressure affecting pregnancy in first trimester, antepartum; HSV infection; and Gonorrhea affecting pregnancy, antepartum, first trimester on their problem list.  Patient reports no complaints.  Contractions: Not present. Vag. Bleeding: None.  Movement: Present. Denies leaking of fluid.   The following portions of the patient's history were reviewed and updated as appropriate: allergies, current medications, past family history, past medical history, past social history, past surgical history and problem list.   Objective:   Vitals:   12/19/18 0948  BP: (!) 147/81  Pulse: (!) 105  Weight: 180 lb 11.2 oz (82 kg)    Fetal Status: Fetal Heart Rate (bpm): 156   Movement: Present     General:  Alert, oriented and cooperative. Patient is in no acute distress.  Skin: Skin is warm and dry. No rash noted.   Cardiovascular: Normal heart rate noted  Respiratory: Normal respiratory effort, no problems with respiration noted  Abdomen: Soft, gravid, appropriate for gestational age.  Pain/Pressure: Absent     Pelvic: Cervical exam deferred        Extremities: Normal range of motion.  Edema: None  Mental Status: Normal mood and affect. Normal behavior. Normal judgment and thought content.   Assessment and Plan:  Pregnancy: G2P0010 at [redacted]w[redacted]d 1. Supervision of high risk pregnancy, antepartum Continue routine prenatal care.  2. Elevated blood pressure in pregnancy, second trimester Continue  Labetalol Will see about BP cuff for her, and then be able to space out visits.  General obstetric precautions including but not limited to vaginal bleeding, contractions, leaking of fluid and fetal movement were reviewed in detail with  the patient. Please refer to After Visit Summary for other counseling recommendations.   Return in 4 weeks (on 01/16/2019).  Future Appointments  Date Time Provider Department Center  01/04/2019 10:20 AM WH-MFC NURSE WH-MFC MFC-US  01/04/2019 10:30 AM WH-MFC [place] 5 WH-MFCUS MFC-US  01/16/2019  9:15 AM Reva Bores, MD WOC-WOCA WOC    Reva Bores, MD

## 2018-12-25 ENCOUNTER — Encounter: Payer: Self-pay | Admitting: *Deleted

## 2019-01-04 ENCOUNTER — Ambulatory Visit (HOSPITAL_COMMUNITY): Payer: Medicaid Other

## 2019-01-16 ENCOUNTER — Other Ambulatory Visit (HOSPITAL_COMMUNITY)
Admission: RE | Admit: 2019-01-16 | Discharge: 2019-01-16 | Disposition: A | Discharge status: Home / Self Care | Payer: Medicaid Other | Source: Ambulatory Visit | Attending: Family Medicine | Admitting: Family Medicine

## 2019-01-16 ENCOUNTER — Other Ambulatory Visit: Payer: Self-pay

## 2019-01-16 ENCOUNTER — Ambulatory Visit (INDEPENDENT_AMBULATORY_CARE_PROVIDER_SITE_OTHER): Payer: Medicaid Other | Admitting: Obstetrics and Gynecology

## 2019-01-16 ENCOUNTER — Encounter: Payer: Self-pay | Admitting: Family Medicine

## 2019-01-16 VITALS — BP 130/84 | HR 94 | Temp 98.1°F | Wt 192.2 lb

## 2019-01-16 DIAGNOSIS — Z3A22 22 weeks gestation of pregnancy: Secondary | ICD-10-CM

## 2019-01-16 DIAGNOSIS — B9689 Other specified bacterial agents as the cause of diseases classified elsewhere: Secondary | ICD-10-CM | POA: Diagnosis not present

## 2019-01-16 DIAGNOSIS — O161 Unspecified maternal hypertension, first trimester: Secondary | ICD-10-CM

## 2019-01-16 DIAGNOSIS — N760 Acute vaginitis: Secondary | ICD-10-CM | POA: Insufficient documentation

## 2019-01-16 DIAGNOSIS — O0990 Supervision of high risk pregnancy, unspecified, unspecified trimester: Secondary | ICD-10-CM

## 2019-01-16 DIAGNOSIS — O162 Unspecified maternal hypertension, second trimester: Secondary | ICD-10-CM

## 2019-01-16 DIAGNOSIS — O0992 Supervision of high risk pregnancy, unspecified, second trimester: Secondary | ICD-10-CM

## 2019-01-16 DIAGNOSIS — O26892 Other specified pregnancy related conditions, second trimester: Secondary | ICD-10-CM

## 2019-01-16 MED ORDER — METRONIDAZOLE 500 MG PO TABS: 500.0000 mg | ORAL_TABLET | Freq: Two times a day (BID) | ORAL | 0 refills | Status: DC

## 2019-01-16 MED ORDER — FLUCONAZOLE 150 MG PO TABS: 150.0000 mg | ORAL_TABLET | Freq: Once | ORAL | 0 refills | Status: AC

## 2019-01-16 MED ORDER — ASPIRIN EC 81 MG PO TBEC: 81.0000 mg | DELAYED_RELEASE_TABLET | Freq: Every day | ORAL | 1 refills | Status: DC

## 2019-01-16 NOTE — Progress Notes (Signed)
   PRENATAL VISIT NOTE  Subjective:  Robyn Miller is a 23 y.o. G2P0010 at [redacted]w[redacted]d being seen today for ongoing prenatal care.  She is currently monitored for the following issues for this high-risk pregnancy and has Supervision of high risk pregnancy, antepartum; Elevated blood pressure affecting pregnancy in first trimester, antepartum; HSV infection; and Gonorrhea affecting pregnancy, antepartum, first trimester on their problem list.  Patient reports History of loss. Requested to come into office today for visit..  Contractions: Not present. Vag. Bleeding: None.  Movement: Present. Denies leaking of fluid.   The following portions of the patient's history were reviewed and updated as appropriate: allergies, current medications, past family history, past medical history, past social history, past surgical history and problem list.   Objective:   Vitals:   01/16/19 0923  BP: 130/84  Pulse: 94  Temp: 98.1 F (36.7 C)  Weight: 192 lb 3.2 oz (87.2 kg)    Fetal Status: Fetal Heart Rate (bpm): 152   Movement: Present     General:  Alert, oriented and cooperative. Patient is in no acute distress.  Skin: Skin is warm and dry. No rash noted.   Cardiovascular: Normal heart rate noted  Respiratory: Normal respiratory effort, no problems with respiration noted  Abdomen: Soft, gravid, appropriate for gestational age.  Pain/Pressure: Absent     Pelvic: Cervical exam deferred        Extremities: Normal range of motion.  Edema: None  Mental Status: Normal mood and affect. Normal behavior. Normal judgment and thought content.   Assessment and Plan:  Pregnancy: G2P0010 at [redacted]w[redacted]d  1. Elevated blood pressure affecting pregnancy in first trimester, antepartum  Will need growth [place] around 28 weeks, [place] scheduled for 5/1 Return in 4 weeks for 28 week labs and 2 hour GTT.  Bp good today Continue BASA   Preterm labor symptoms and general obstetric precautions including but not limited to vaginal  bleeding, contractions, leaking of fluid and fetal movement were reviewed in detail with the patient. Please refer to After Visit Summary for other counseling recommendations.   Return in about 4 weeks (around 02/13/2019) for For 28 week labs, come fasting for 2 hour GTT.  Future Appointments  Date Time Provider Department Center  02/01/2019  9:45 AM WH-MFC NURSE WH-MFC MFC-US  02/01/2019  9:45 AM WH-MFC [place] 2 WH-MFCUS MFC-US  02/13/2019  8:20 AM WOC-WOCA LAB WOC-WOCA WOC    Venia Carbon, NP

## 2019-01-17 LAB — CERVICOVAGINAL ANCILLARY ONLY
Bacterial vaginitis: NEGATIVE
Candida vaginitis: NEGATIVE
Chlamydia: NEGATIVE
Neisseria Gonorrhea: NEGATIVE
Trichomonas: NEGATIVE

## 2019-01-29 LAB — INHERITEST(R) CF/SMA PANEL

## 2019-01-29 LAB — HEMOGLOBINOPATHY EVALUATION
HGB C: 0 %
HGB S: 0 %
HGB VARIANT: 0 %
Hemoglobin A2 Quantitation: 2.6 % (ref 1.8–3.2)
Hemoglobin F Quantitation: 0 % (ref 0.0–2.0)
Hgb A: 97.4 % (ref 96.4–98.8)

## 2019-02-01 ENCOUNTER — Other Ambulatory Visit (HOSPITAL_COMMUNITY): Payer: Self-pay | Admitting: *Deleted

## 2019-02-01 ENCOUNTER — Ambulatory Visit (HOSPITAL_COMMUNITY): Payer: Medicaid Other | Admitting: *Deleted

## 2019-02-01 ENCOUNTER — Encounter (HOSPITAL_COMMUNITY): Payer: Self-pay

## 2019-02-01 ENCOUNTER — Other Ambulatory Visit: Payer: Self-pay

## 2019-02-01 ENCOUNTER — Ambulatory Visit (HOSPITAL_COMMUNITY)
Admission: RE | Admit: 2019-02-01 | Discharge: 2019-02-01 | Disposition: A | Discharge status: Home / Self Care | Payer: Medicaid Other | Source: Ambulatory Visit | Attending: Obstetrics and Gynecology | Admitting: Obstetrics and Gynecology

## 2019-02-01 ENCOUNTER — Other Ambulatory Visit: Payer: Self-pay | Admitting: Medical

## 2019-02-01 VITALS — BP 135/80 | HR 103 | Temp 98.3°F

## 2019-02-01 DIAGNOSIS — O10919 Unspecified pre-existing hypertension complicating pregnancy, unspecified trimester: Secondary | ICD-10-CM | POA: Diagnosis not present

## 2019-02-01 DIAGNOSIS — O161 Unspecified maternal hypertension, first trimester: Secondary | ICD-10-CM

## 2019-02-01 DIAGNOSIS — Z362 Encounter for other antenatal screening follow-up: Secondary | ICD-10-CM

## 2019-02-11 ENCOUNTER — Telehealth: Payer: Self-pay | Admitting: Obstetrics & Gynecology

## 2019-02-11 NOTE — Telephone Encounter (Signed)
Called the patient to confirm the appointment, the patient verbalized understating. °

## 2019-02-13 ENCOUNTER — Ambulatory Visit (INDEPENDENT_AMBULATORY_CARE_PROVIDER_SITE_OTHER): Payer: Medicaid Other | Admitting: Obstetrics & Gynecology

## 2019-02-13 ENCOUNTER — Other Ambulatory Visit: Payer: Medicaid Other

## 2019-02-13 ENCOUNTER — Other Ambulatory Visit: Payer: Self-pay

## 2019-02-13 VITALS — BP 141/87 | HR 93 | Temp 98.0°F | Wt 197.1 lb

## 2019-02-13 DIAGNOSIS — O0990 Supervision of high risk pregnancy, unspecified, unspecified trimester: Secondary | ICD-10-CM

## 2019-02-13 DIAGNOSIS — O10912 Unspecified pre-existing hypertension complicating pregnancy, second trimester: Secondary | ICD-10-CM | POA: Diagnosis not present

## 2019-02-13 DIAGNOSIS — O10919 Unspecified pre-existing hypertension complicating pregnancy, unspecified trimester: Secondary | ICD-10-CM

## 2019-02-13 DIAGNOSIS — Z3A26 26 weeks gestation of pregnancy: Secondary | ICD-10-CM

## 2019-02-13 DIAGNOSIS — O0992 Supervision of high risk pregnancy, unspecified, second trimester: Secondary | ICD-10-CM

## 2019-02-13 DIAGNOSIS — Z23 Encounter for immunization: Secondary | ICD-10-CM | POA: Diagnosis not present

## 2019-02-13 LAB — COMPREHENSIVE METABOLIC PANEL
ALT: 14 IU/L (ref 0–32)
AST: 20 IU/L (ref 0–40)
Albumin/Globulin Ratio: 1.3 (ref 1.2–2.2)
Albumin: 3.9 g/dL (ref 3.9–5.0)
Alkaline Phosphatase: 93 IU/L (ref 39–117)
BUN/Creatinine Ratio: 9 (ref 9–23)
BUN: 5 mg/dL — ABNORMAL LOW (ref 6–20)
Bilirubin Total: <0.2 mg/dL (ref 0.0–1.2)
CO2: 21 mmol/L (ref 20–29)
Calcium: 9.2 mg/dL (ref 8.7–10.2)
Chloride: 101 mmol/L (ref 96–106)
Creatinine, Ser: 0.56 mg/dL — ABNORMAL LOW (ref 0.57–1.00)
GFR calc Af Amer: 150 mL/min/{1.73_m2} (ref >59)
GFR calc non Af Amer: 130 mL/min/{1.73_m2} (ref >59)
Globulin, Total: 3 g/dL (ref 1.5–4.5)
Glucose: 101 mg/dL — ABNORMAL HIGH (ref 65–99)
Potassium: 4.1 mmol/L (ref 3.5–5.2)
Sodium: 134 mmol/L (ref 134–144)
Total Protein: 6.9 g/dL (ref 6.0–8.5)

## 2019-02-13 NOTE — Patient Instructions (Signed)
Return to office for any scheduled appointments. Call the office or go to the MAU at [facility] at  if:  You begin to have strong, frequent contractions  Your water breaks.  Sometimes it is a big gush of fluid, sometimes it is just a trickle that keeps getting your panties wet or running down your legs  You have vaginal bleeding.  It is normal to have a small amount of spotting if your cervix was checked.   You do not feel your baby moving like normal.  If you do not, get something to eat and drink and lay down and focus on feeling your baby move.   If your baby is still not moving like normal, you should call the office or go to MAU.  Any other obstetric concerns.   

## 2019-02-13 NOTE — Progress Notes (Signed)
   PRENATAL VISIT NOTE  Subjective:  Robyn Miller is a 23 y.o. G2P0010 at [redacted]w[redacted]d being seen today for ongoing prenatal care.  She is currently monitored for the following issues for this high-risk pregnancy and has Supervision of high risk pregnancy, antepartum; Preexisting hypertension complicating pregnancy, antepartum; HSV infection; and Gonorrhea affecting pregnancy, antepartum, first trimester on their problem list.  Patient reports no complaints.  Contractions: Not present. Vag. Bleeding: None.  Movement: Present. Denies leaking of fluid.   The following portions of the patient's history were reviewed and updated as appropriate: allergies, current medications, past family history, past medical history, past social history, past surgical history and problem list.   Objective:   Vitals:   02/13/19 0954  BP: (!) 141/87  Pulse: 93  Temp: 98 F (36.7 C)  Weight: 197 lb 1.6 oz (89.4 kg)    Fetal Status: Fetal Heart Rate (bpm): 156 Fundal Height: 27 cm Movement: Present     General:  Alert, oriented and cooperative. Patient is in no acute distress.  Skin: Skin is warm and dry. No rash noted.   Cardiovascular: Normal heart rate noted  Respiratory: Normal respiratory effort, no problems with respiration noted  Abdomen: Soft, gravid, appropriate for gestational age.  Pain/Pressure: Absent     Pelvic: Cervical exam deferred        Extremities: Normal range of motion.  Edema: None  Mental Status: Normal mood and affect. Normal behavior. Normal judgment and thought content.   Assessment and Plan:  Pregnancy: G2P0010 at [redacted]w[redacted]d 1. Preexisting hypertension complicating pregnancy, antepartum Slightly elevated BP, no symptoms.  Will check labs today, preeclampsia precautions reviewed. - Comprehensive metabolic panel - Protein / creatinine ratio, urine  2. Supervision of high risk pregnancy, antepartum Third trimester labs today. - Tdap vaccine greater than or equal to 7yo IM Preterm  labor symptoms and general obstetric precautions including but not limited to vaginal bleeding, contractions, leaking of fluid and fetal movement were reviewed in detail with the patient. Please refer to After Visit Summary for other counseling recommendations.   Return in about 2 weeks (around 02/27/2019) for Virtual OB Visit  4 weeks: OFFICE HOB visit.  Future Appointments  Date Time Provider Department Center  02/27/2019  9:15 AM Adam Phenix, MD WOC-WOCA WOC  03/01/2019  9:30 AM WH-MFC NURSE WH-MFC MFC-US  03/01/2019  9:30 AM WH-MFC [place] 5 WH-MFCUS MFC-US  03/13/2019  9:35 AM Conan Bowens, MD WOC-WOCA WOC    Jaynie Collins, MD

## 2019-02-14 ENCOUNTER — Encounter: Payer: Self-pay | Admitting: Obstetrics & Gynecology

## 2019-02-14 ENCOUNTER — Other Ambulatory Visit: Payer: Self-pay | Admitting: Obstetrics & Gynecology

## 2019-02-14 DIAGNOSIS — O99013 Anemia complicating pregnancy, third trimester: Secondary | ICD-10-CM

## 2019-02-14 LAB — GLUCOSE TOLERANCE, 2 HOURS W/ 1HR
Glucose, 1 hour: 108 mg/dL (ref 65–179)
Glucose, 2 hour: 108 mg/dL (ref 65–152)
Glucose, Fasting: 82 mg/dL (ref 65–91)

## 2019-02-14 LAB — CBC
Hematocrit: 31.6 % — ABNORMAL LOW (ref 34.0–46.6)
Hemoglobin: 9.9 g/dL — ABNORMAL LOW (ref 11.1–15.9)
MCH: 25.6 pg — ABNORMAL LOW (ref 26.6–33.0)
MCHC: 31.3 g/dL — ABNORMAL LOW (ref 31.5–35.7)
MCV: 82 fL (ref 79–97)
Platelets: 296 10*3/uL (ref 150–450)
RBC: 3.87 x10E6/uL (ref 3.77–5.28)
RDW: 14.7 % (ref 11.7–15.4)
WBC: 12.1 10*3/uL — ABNORMAL HIGH (ref 3.4–10.8)

## 2019-02-14 LAB — PROTEIN / CREATININE RATIO, URINE
Creatinine, Urine: 35.9 mg/dL
Protein, Ur: 6.4 mg/dL
Protein/Creat Ratio: 178 mg/g creat (ref 0–200)

## 2019-02-14 LAB — RPR: RPR Ser Ql: NONREACTIVE

## 2019-02-14 LAB — HIV ANTIBODY (ROUTINE TESTING W REFLEX): HIV Screen 4th Generation wRfx: NONREACTIVE

## 2019-02-14 MED ORDER — FERROUS SULFATE 325 (65 FE) MG PO TABS: 325.0000 mg | ORAL_TABLET | Freq: Two times a day (BID) | ORAL | 1 refills | Status: DC

## 2019-02-15 ENCOUNTER — Encounter: Payer: Self-pay | Admitting: *Deleted

## 2019-02-27 ENCOUNTER — Telehealth (INDEPENDENT_AMBULATORY_CARE_PROVIDER_SITE_OTHER): Payer: Medicaid Other | Admitting: Obstetrics & Gynecology

## 2019-02-27 ENCOUNTER — Other Ambulatory Visit: Payer: Self-pay

## 2019-02-27 VITALS — BP 128/85 | HR 90

## 2019-02-27 DIAGNOSIS — O0993 Supervision of high risk pregnancy, unspecified, third trimester: Secondary | ICD-10-CM | POA: Diagnosis not present

## 2019-02-27 DIAGNOSIS — O10913 Unspecified pre-existing hypertension complicating pregnancy, third trimester: Secondary | ICD-10-CM | POA: Diagnosis not present

## 2019-02-27 DIAGNOSIS — Z3A28 28 weeks gestation of pregnancy: Secondary | ICD-10-CM | POA: Diagnosis not present

## 2019-02-27 DIAGNOSIS — O10919 Unspecified pre-existing hypertension complicating pregnancy, unspecified trimester: Secondary | ICD-10-CM

## 2019-02-27 DIAGNOSIS — O0990 Supervision of high risk pregnancy, unspecified, unspecified trimester: Secondary | ICD-10-CM

## 2019-02-27 NOTE — Patient Instructions (Signed)

## 2019-02-27 NOTE — Progress Notes (Signed)
   TELEHEALTH VIRTUAL OBSTETRICS VISIT ENCOUNTER NOTE  I connected with Robyn Miller on 02/27/19 at  9:15 AM EDT by telephone at home and verified that I am speaking with the correct person using two identifiers.   I discussed the limitations, risks, security and privacy concerns of performing an evaluation and management service by telephone and the availability of in person appointments. I also discussed with the patient that there may be a patient responsible charge related to this service. The patient expressed understanding and agreed to proceed.  Subjective:  Robyn Miller is a 23 y.o. G2P0010 at [redacted]w[redacted]d being followed for ongoing prenatal care.  She is currently monitored for the following issues for this high-risk pregnancy and has Supervision of high risk pregnancy, antepartum; Preexisting hypertension complicating pregnancy, antepartum; HSV infection; Gonorrhea affecting pregnancy, antepartum, first trimester; and Anemia affecting pregnancy in third trimester on their problem list.  Patient reports no complaints. Reports fetal movement. Denies any contractions, bleeding or leaking of fluid.   The following portions of the patient's history were reviewed and updated as appropriate: allergies, current medications, past family history, past medical history, past social history, past surgical history and problem list.   Objective:   General:  Alert, oriented and cooperative.   Mental Status: Normal mood and affect perceived. Normal judgment and thought content.  Rest of physical exam deferred due to type of encounter  Assessment and Plan:  Pregnancy: G2P0010 at [redacted]w[redacted]d 1. Supervision of high risk pregnancy, antepartum CHTN  2. Preexisting hypertension complicating pregnancy, antepartum F/u growth [place] 2 days  Preterm labor symptoms and general obstetric precautions including but not limited to vaginal bleeding, contractions, leaking of fluid and fetal movement were reviewed in detail  with the patient.  I discussed the assessment and treatment plan with the patient. The patient was provided an opportunity to ask questions and all were answered. The patient agreed with the plan and demonstrated an understanding of the instructions. The patient was advised to call back or seek an in-person office evaluation/go to MAU at [facility] for any urgent or concerning symptoms. Please refer to After Visit Summary for other counseling recommendations.   I provided 10 minutes of non-face-to-face time during this encounter.  Return in about 2 weeks (around 03/13/2019).  Future Appointments  Date Time Provider Department Center  03/01/2019  9:30 AM WH-MFC NURSE WH-MFC MFC-US  03/01/2019  9:30 AM WH-MFC [place] 5 WH-MFCUS MFC-US  03/13/2019  9:35 AM Conan Bowens, MD [place]    Scheryl Darter, MD Center for [facility], [facility]

## 2019-02-27 NOTE — Progress Notes (Signed)
I connected with  [name] on 02/27/19 at  9:15 AM EDT by telephone and verified that I am speaking with the correct person using two identifiers.   I discussed the limitations, risks, security and privacy concerns of performing an evaluation and management service by telephone and the availability of in person appointments. I also discussed with the patient that there may be a patient responsible charge related to this service. The patient expressed understanding and agreed to proceed. Patient is active in [employer] and last bp 02/11/19 114/83. Denies headaches Took bp while on phone, and repeated bp due to borderline.  Teniola Tseng,RN 02/27/2019  9:17 AM

## 2019-03-01 ENCOUNTER — Encounter (HOSPITAL_COMMUNITY): Payer: Self-pay

## 2019-03-01 ENCOUNTER — Ambulatory Visit (HOSPITAL_COMMUNITY)
Admission: RE | Admit: 2019-03-01 | Discharge: 2019-03-01 | Disposition: A | Discharge status: Home / Self Care | Payer: Medicaid Other | Source: Ambulatory Visit | Attending: Maternal & Fetal Medicine | Admitting: Maternal & Fetal Medicine

## 2019-03-01 ENCOUNTER — Ambulatory Visit (HOSPITAL_COMMUNITY): Payer: Medicaid Other | Admitting: *Deleted

## 2019-03-01 ENCOUNTER — Other Ambulatory Visit (HOSPITAL_COMMUNITY): Payer: Self-pay | Admitting: *Deleted

## 2019-03-01 ENCOUNTER — Other Ambulatory Visit: Payer: Self-pay

## 2019-03-01 VITALS — BP 129/72 | HR 101 | Temp 98.8°F

## 2019-03-01 DIAGNOSIS — O10013 Pre-existing essential hypertension complicating pregnancy, third trimester: Secondary | ICD-10-CM | POA: Diagnosis not present

## 2019-03-01 DIAGNOSIS — O10919 Unspecified pre-existing hypertension complicating pregnancy, unspecified trimester: Secondary | ICD-10-CM | POA: Diagnosis not present

## 2019-03-01 DIAGNOSIS — Z362 Encounter for other antenatal screening follow-up: Secondary | ICD-10-CM | POA: Diagnosis not present

## 2019-03-01 DIAGNOSIS — Z3A29 29 weeks gestation of pregnancy: Secondary | ICD-10-CM

## 2019-03-04 ENCOUNTER — Other Ambulatory Visit (HOSPITAL_COMMUNITY): Payer: Self-pay | Admitting: *Deleted

## 2019-03-04 DIAGNOSIS — O10919 Unspecified pre-existing hypertension complicating pregnancy, unspecified trimester: Secondary | ICD-10-CM

## 2019-03-12 ENCOUNTER — Telehealth: Payer: Self-pay | Admitting: Family Medicine

## 2019-03-12 NOTE — Telephone Encounter (Signed)
Called and spoke to patient instructed about wearing face covering, patient aware no visitors are allowed due to [place], ask patient about having any symptoms and patient said NO.

## 2019-03-13 ENCOUNTER — Ambulatory Visit (INDEPENDENT_AMBULATORY_CARE_PROVIDER_SITE_OTHER): Payer: Medicaid Other | Admitting: Obstetrics and Gynecology

## 2019-03-13 ENCOUNTER — Other Ambulatory Visit: Payer: Self-pay

## 2019-03-13 ENCOUNTER — Encounter: Payer: Self-pay | Admitting: Obstetrics and Gynecology

## 2019-03-13 VITALS — BP 134/84 | HR 82 | Temp 98.4°F | Wt 205.4 lb

## 2019-03-13 DIAGNOSIS — M419 Scoliosis, unspecified: Secondary | ICD-10-CM

## 2019-03-13 DIAGNOSIS — O10919 Unspecified pre-existing hypertension complicating pregnancy, unspecified trimester: Secondary | ICD-10-CM

## 2019-03-13 DIAGNOSIS — O10913 Unspecified pre-existing hypertension complicating pregnancy, third trimester: Secondary | ICD-10-CM

## 2019-03-13 DIAGNOSIS — O0990 Supervision of high risk pregnancy, unspecified, unspecified trimester: Secondary | ICD-10-CM

## 2019-03-13 DIAGNOSIS — O98211 Gonorrhea complicating pregnancy, first trimester: Secondary | ICD-10-CM

## 2019-03-13 DIAGNOSIS — Z3A30 30 weeks gestation of pregnancy: Secondary | ICD-10-CM

## 2019-03-13 DIAGNOSIS — O98213 Gonorrhea complicating pregnancy, third trimester: Secondary | ICD-10-CM

## 2019-03-13 DIAGNOSIS — O99013 Anemia complicating pregnancy, third trimester: Secondary | ICD-10-CM

## 2019-03-13 DIAGNOSIS — B009 Herpesviral infection, unspecified: Secondary | ICD-10-CM

## 2019-03-13 MED ORDER — FERROUS SULFATE 325 (65 FE) MG PO TABS: 325.0000 mg | ORAL_TABLET | Freq: Two times a day (BID) | ORAL | 1 refills | Status: DC

## 2019-03-13 NOTE — Progress Notes (Signed)
   PRENATAL VISIT NOTE  Subjective:  Robyn Miller is a 23 y.o. G2P0010 at [redacted]w[redacted]d being seen today for ongoing prenatal care.  She is currently monitored for the following issues for this high-risk pregnancy and has Supervision of high risk pregnancy, antepartum; Preexisting hypertension complicating pregnancy, antepartum; HSV infection; Gonorrhea affecting pregnancy, antepartum, first trimester; Anemia affecting pregnancy in third trimester; and Scoliosis on their problem list.  Patient reports nipple pain, has nipple piercings.  Contractions: Not present. Vag. Bleeding: None.  Movement: Present. Denies leaking of fluid.   The following portions of the patient's history were reviewed and updated as appropriate: allergies, current medications, past family history, past medical history, past social history, past surgical history and problem list.   Objective:   Vitals:   03/13/19 0954  BP: 134/84  Pulse: 82  Temp: 98.4 F (36.9 C)  Weight: 205 lb 6.4 oz (93.2 kg)    Fetal Status: Fetal Heart Rate (bpm): 154   Movement: Present     General:  Alert, oriented and cooperative. Patient is in no acute distress.  Skin: Skin is warm and dry. No rash noted.   Cardiovascular: Normal heart rate noted  Respiratory: Normal respiratory effort, no problems with respiration noted  Abdomen: Soft, gravid, appropriate for gestational age.  Pain/Pressure: Absent     Pelvic: Cervical exam deferred        Extremities: Normal range of motion.  Edema: None  Mental Status: Normal mood and affect. Normal behavior. Normal judgment and thought content.   Assessment and Plan:  Pregnancy: G2P0010 at [redacted]w[redacted]d  1. Supervision of high risk pregnancy, antepartum  2. Gonorrhea affecting pregnancy, antepartum, first trimester Neg TOC  3. Anemia affecting pregnancy in third trimester - Has not been taking iron due to issue with pharmacy - Resent to pharmacy today  4. HSV infection ppx 36 weeks  5. Preexisting  hypertension complicating pregnancy, antepartum Last growth 53rd%tile Growth [place] scheduled for 6/26, BPP starting @ 34 weeks On labetalol 200 mg BID On aspirin 81 mcg daily BP well controlled  6. Scoliosis, unspecified scoliosis type, unspecified spinal region - Dx as teenager, no h/o surgery - will get records and have anesthesia consult   Preterm labor symptoms and general obstetric precautions including but not limited to vaginal bleeding, contractions, leaking of fluid and fetal movement were reviewed in detail with the patient. Please refer to After Visit Summary for other counseling recommendations.   Return in about 2 weeks (around 03/27/2019) for OB visit (MD), virtual.  Future Appointments  Date Time Provider [place]  03/29/2019  9:30 AM [place] MFC-US  03/29/2019  9:30 AM WH-MFC [place] 5 WH-MFCUS MFC-US  04/08/2019 10:45 AM [place] MFC-US  04/08/2019 10:45 AM WH-MFC [place] 2 WH-MFCUS MFC-US  04/12/2019  9:30 AM WH-MFC NURSE [place] MFC-US  04/12/2019  9:30 AM WH-MFC [place] 5 WH-MFCUS MFC-US    Sloan Leiter, MD

## 2019-03-21 ENCOUNTER — Other Ambulatory Visit: Payer: Self-pay

## 2019-03-21 DIAGNOSIS — O161 Unspecified maternal hypertension, first trimester: Secondary | ICD-10-CM

## 2019-03-22 MED ORDER — LABETALOL HCL 200 MG PO TABS: 200.0000 mg | ORAL_TABLET | Freq: Two times a day (BID) | ORAL | 0 refills | Status: DC

## 2019-03-27 ENCOUNTER — Telehealth (INDEPENDENT_AMBULATORY_CARE_PROVIDER_SITE_OTHER): Payer: Medicaid Other | Admitting: Obstetrics & Gynecology

## 2019-03-27 ENCOUNTER — Encounter: Payer: Self-pay | Admitting: Obstetrics & Gynecology

## 2019-03-27 ENCOUNTER — Other Ambulatory Visit: Payer: Self-pay

## 2019-03-27 VITALS — BP 103/83

## 2019-03-27 DIAGNOSIS — O0990 Supervision of high risk pregnancy, unspecified, unspecified trimester: Secondary | ICD-10-CM

## 2019-03-27 DIAGNOSIS — O0993 Supervision of high risk pregnancy, unspecified, third trimester: Secondary | ICD-10-CM

## 2019-03-27 DIAGNOSIS — O10919 Unspecified pre-existing hypertension complicating pregnancy, unspecified trimester: Secondary | ICD-10-CM

## 2019-03-27 DIAGNOSIS — O99013 Anemia complicating pregnancy, third trimester: Secondary | ICD-10-CM

## 2019-03-27 DIAGNOSIS — Z3A32 32 weeks gestation of pregnancy: Secondary | ICD-10-CM

## 2019-03-27 NOTE — Patient Instructions (Signed)
Return to office for any scheduled appointments. Call the office or go to the MAU at [facility] at [place] if:  You begin to have strong, frequent contractions  Your water breaks.  Sometimes it is a big gush of fluid, sometimes it is just a trickle that keeps getting your panties wet or running down your legs  You have vaginal bleeding.  It is normal to have a small amount of spotting if your cervix was checked.   You do not feel your baby moving like normal.  If you do not, get something to eat and drink and lay down and focus on feeling your baby move.   If your baby is still not moving like normal, you should call the office or go to MAU.  Any other obstetric concerns.   

## 2019-03-27 NOTE — Progress Notes (Signed)
[facility] VIRTUAL VIDEO VISIT ENCOUNTER NOTE  Provider location: [facility] [place] [facility]   I connected with Delila Spence on 03/27/19 at  3:55 PM EDT by MyChart Video Encounter at home and verified that I am speaking with the correct person using two identifiers.   I discussed the limitations, risks, security and privacy concerns of performing an evaluation and management service by telephone and the availability of in person appointments. I also discussed with the patient that there may be a patient responsible charge related to this service. The patient expressed understanding and agreed to proceed. Subjective:  Robyn Miller is a 23 y.o. G2P0010 at [redacted]w[redacted]d being seen today for ongoing prenatal care.  She is currently monitored for the following issues for this high-risk pregnancy and has Supervision of high risk pregnancy, antepartum; Preexisting hypertension complicating pregnancy, antepartum; HSV infection; Gonorrhea affecting pregnancy, antepartum, first trimester; Anemia affecting pregnancy in third trimester; and Scoliosis on their problem list.  Patient reports no complaints.  Contractions: Not present. Vag. Bleeding: None.  Movement: Present. Denies any leaking of fluid.   The following portions of the patient's history were reviewed and updated as appropriate: allergies, current medications, past family history, past medical history, past social history, past surgical history and problem list.   Objective:   Vitals:   03/27/19 1604  BP: 103/83    Fetal Status:     Movement: Present     General:  Alert, oriented and cooperative. Patient is in no acute distress.  Respiratory: Normal respiratory effort, no problems with respiration noted  Mental Status: Normal mood and affect. Normal behavior. Normal judgment and thought content.  Rest of physical exam deferred due to type of encounter  Imaging: [place] Mfm Ob Follow Up  Result Date:  03/01/2019 ----------------------------------------------------------------------  OBSTETRICS REPORT                       (Signed Final 03/01/2019 11:19 am) ---------------------------------------------------------------------- Patient Info  ID #:       [id number]                          D.O.B.:  June 24, 1996 (23 yrs)  Name:       Robyn Miller                  Visit Date: 03/01/2019 09:42 am ---------------------------------------------------------------------- Performed By  Performed By:     Georgie Chard        Ref. Address:     [address] [place],                                                             [place] [address]  Attending:        Einar Grad  Judeth CornfieldShankar MD        [facility]  Referred By:      Marny LowensteinJULIE N WENZEL                    [place] ---------------------------------------------------------------------- Orders   #  Description                          Code         Ordered By   1  US MFM OB FOLLOW UP                  [id number]     Lin LandsmanORENTHIAN                                                        BOOKER  ----------------------------------------------------------------------   #  Order #                    Accession #                 Episode #   1  [id number]                  [id number]                  [id number]  ---------------------------------------------------------------------- Indications   Hypertension - Chronic/Pre-existing            O10.019   (labetalot and ASA)   Encounter for other antenatal screening        Z36.2   follow-up (low risk NIPS)   [redacted] weeks gestation of pregnancy                Z3A.29  ---------------------------------------------------------------------- Vital Signs  Weight (lb): 197                               Height:        5'7"  BMI:         30.85  ---------------------------------------------------------------------- Fetal Evaluation  Num Of Fetuses:         1  Fetal Heart Rate(bpm):  146  Cardiac Activity:       Observed  Presentation:           Cephalic  Placenta:               Posterior  P. Cord Insertion:      Visualized  Amniotic Fluid  AFI FV:      Within normal limits  AFI Sum(cm)     %Tile       Largest Pocket(cm)  12.16           30          5.08  RUQ(cm)       RLQ(cm)  LUQ(cm)        LLQ(cm)  5.08          2.59          4.49           0 ---------------------------------------------------------------------- Biometry  BPD:      75.2  mm     G. Age:  30w 1d         70  %    CI:         74.1   %    70 - 86                                                          FL/HC:      19.9   %    19.6 - 20.8  HC:      277.4  mm     G. Age:  30w 2d         52  %    HC/AC:      1.12        0.99 - 1.21  AC:      247.9  mm     G. Age:  29w 0d         40  %    FL/BPD:     73.3   %    71 - 87  FL:       55.1  mm     G. Age:  29w 0d         33  %    FL/AC:      22.2   %    20 - 24  HUM:      50.4  mm     G. Age:  29w 4d         53  %  LV:        2.6  mm  Est. FW:    1360  gm           3 lb     53  % ---------------------------------------------------------------------- OB History  Gravidity:    2          SAB:   1 ---------------------------------------------------------------------- Gestational Age  LMP:           30w 0d        Date:  08/03/18                 EDD:   05/10/19  U/S Today:     29w 4d                                        EDD:   05/13/19  Best:          29w 1d     Det. By:  U/S  (12/07/18)          EDD:   05/16/19 ---------------------------------------------------------------------- Anatomy  Cranium:               Appears normal         Aortic Arch:            Previously seen  Cavum:  Appears normal         Ductal Arch:            Previously seen  Ventricles:            Appears normal         Diaphragm:              Appears normal   Choroid Plexus:        Previously seen        Stomach:                Appears normal, left                                                                        sided  Cerebellum:            Previously seen        Abdomen:                Appears normal  Posterior Fossa:       Previously seen        Abdominal Wall:         Previously seen  Nuchal Fold:           Previously seen        Cord Vessels:           Not well visualized  Face:                  Appears normal         Kidneys:                Appear normal                         (orbits and profile)  Lips:                  Appears normal         Bladder:                Appears normal  Thoracic:              Previously seen        Spine:                  Previously seen  Heart:                 Appears normal         Upper Extremities:      Previously seen                         (4CH, axis, and                         situs)  RVOT:                  Previously seen        Lower Extremities:      Previously seen  LVOT:                  Previously seen ---------------------------------------------------------------------- Impression  Amniotic fluid is  normal and good fetal activity is seen. Fetal  growth is appropriate for gestational age. ---------------------------------------------------------------------- Recommendations  An appointment was made for her to return in 4 weeks for  fetal growth assessment.  -Weekly BPP from next visit till delivery. ----------------------------------------------------------------------                  Noralee Spaceavi Shankar, MD Electronically Signed Final Report   03/01/2019 11:19 am ----------------------------------------------------------------------   Assessment and Plan:  Pregnancy: G2P0010 at 673w6d 1. Preexisting hypertension complicating pregnancy, antepartum Stable BP on Labetalol. Will start antenatal testing this week, also has growth scan. Labs next week. - CBC; Future - Comprehensive metabolic panel; Future - Protein  / creatinine ratio, urine; Future  2. Anemia affecting pregnancy in third trimester On iron orally. CBC Latest Ref Rng & Units 02/13/2019 10/23/2018 10/01/2015  WBC 3.4 - 10.8 x10E3/uL 12.1(H) 11.2(H) 6.4  Hemoglobin 11.1 - 15.9 g/dL 1.6(X9.9(L) 10.8(L) 7.6(L)  Hematocrit 34.0 - 46.6 % 31.6(L) 34.3 24.5(L)  Platelets 150 - 450 x10E3/uL 296 366 398  Recheck labs next visit - CBC; Future - Ferritin; Future  3. Supervision of high risk pregnancy, antepartum Preterm labor symptoms and general obstetric precautions including but not limited to vaginal bleeding, contractions, leaking of fluid and fetal movement were reviewed in detail with the patient. I discussed the assessment and treatment plan with the patient. The patient was provided an opportunity to ask questions and all were answered. The patient agreed with the plan and demonstrated an understanding of the instructions. The patient was advised to call back or seek an in-person office evaluation/[facility] for any urgent or concerning symptoms. Please refer to After Visit Summary for other counseling recommendations.   I provided 15 minutes of face-to-face time during this encounter.  Return in about 8 days (around 04/04/2019) for NST, BPP, OFFICE HOB Visit.  Future Appointments  Date Time Provider Department Center  03/29/2019  9:30 AM [facility] NURSE [facility] MFC-US  03/29/2019  9:30 AM [facility] US 5 [facility]-MFCUS MFC-US  04/08/2019 10:45 AM [facility] NURSE [facility] MFC-US  04/08/2019 10:45 AM [facility] US 2 [facility]-MFCUS MFC-US  04/12/2019  9:30 AM [facility] NURSE [facility]FC MFC-US  04/12/2019  9:30 AM [facility] US 5 WH-MFCUS MFC-US    Jaynie CollinsUgonna Devaeh Amadi, MD [facility]

## 2019-03-28 ENCOUNTER — Telehealth: Payer: Self-pay | Admitting: Family Medicine

## 2019-03-28 NOTE — Telephone Encounter (Signed)
Called and spoke with patient about her appointments on 04-03-2019 and 04-04-2019, I explained to there patient due to [place] and it being a Holiday week we had to schedule her appt on two different days, NST and BPP in office on 7-1 and mychart Virtual Visit on 7-2

## 2019-03-29 ENCOUNTER — Ambulatory Visit (HOSPITAL_COMMUNITY): Payer: Medicaid Other | Admitting: *Deleted

## 2019-03-29 ENCOUNTER — Ambulatory Visit (HOSPITAL_COMMUNITY)
Admission: RE | Admit: 2019-03-29 | Discharge: 2019-03-29 | Disposition: A | Discharge status: Home / Self Care | Payer: Medicaid Other | Source: Ambulatory Visit | Attending: Obstetrics and Gynecology | Admitting: Obstetrics and Gynecology

## 2019-03-29 ENCOUNTER — Encounter (HOSPITAL_COMMUNITY): Payer: Self-pay | Admitting: *Deleted

## 2019-03-29 ENCOUNTER — Other Ambulatory Visit: Payer: Self-pay

## 2019-03-29 DIAGNOSIS — O0990 Supervision of high risk pregnancy, unspecified, unspecified trimester: Secondary | ICD-10-CM | POA: Diagnosis not present

## 2019-03-29 DIAGNOSIS — O10013 Pre-existing essential hypertension complicating pregnancy, third trimester: Secondary | ICD-10-CM | POA: Diagnosis not present

## 2019-03-29 DIAGNOSIS — Z362 Encounter for other antenatal screening follow-up: Secondary | ICD-10-CM | POA: Diagnosis not present

## 2019-03-29 DIAGNOSIS — O99013 Anemia complicating pregnancy, third trimester: Secondary | ICD-10-CM | POA: Insufficient documentation

## 2019-03-29 DIAGNOSIS — O10919 Unspecified pre-existing hypertension complicating pregnancy, unspecified trimester: Secondary | ICD-10-CM | POA: Insufficient documentation

## 2019-03-29 DIAGNOSIS — Z3A33 33 weeks gestation of pregnancy: Secondary | ICD-10-CM | POA: Diagnosis not present

## 2019-04-02 ENCOUNTER — Telehealth: Payer: Self-pay | Admitting: Obstetrics and Gynecology

## 2019-04-02 NOTE — Telephone Encounter (Signed)
Attempted to call patient about her appointment on 7/1 @ 11:15. No answer, left voicemail instructing patient to wear a face mask for the entire appointment. Patient instructed that she is not allowed to have any visitors with her for the appointment. Patient instructed that if she has any symptoms not to attend the appointment and to give the office a call instead. Office number and symptoms were left on voicemail.

## 2019-04-03 ENCOUNTER — Other Ambulatory Visit: Payer: Medicaid Other

## 2019-04-04 ENCOUNTER — Telehealth (INDEPENDENT_AMBULATORY_CARE_PROVIDER_SITE_OTHER): Payer: Medicaid Other | Admitting: Obstetrics and Gynecology

## 2019-04-04 ENCOUNTER — Encounter: Payer: Self-pay | Admitting: Obstetrics and Gynecology

## 2019-04-04 ENCOUNTER — Other Ambulatory Visit: Payer: Self-pay

## 2019-04-04 VITALS — BP 125/74 | HR 95

## 2019-04-04 DIAGNOSIS — B009 Herpesviral infection, unspecified: Secondary | ICD-10-CM

## 2019-04-04 DIAGNOSIS — O98913 Unspecified maternal infectious and parasitic disease complicating pregnancy, third trimester: Secondary | ICD-10-CM

## 2019-04-04 DIAGNOSIS — O10919 Unspecified pre-existing hypertension complicating pregnancy, unspecified trimester: Secondary | ICD-10-CM

## 2019-04-04 DIAGNOSIS — O0993 Supervision of high risk pregnancy, unspecified, third trimester: Secondary | ICD-10-CM

## 2019-04-04 DIAGNOSIS — O10913 Unspecified pre-existing hypertension complicating pregnancy, third trimester: Secondary | ICD-10-CM

## 2019-04-04 DIAGNOSIS — Z3A34 34 weeks gestation of pregnancy: Secondary | ICD-10-CM

## 2019-04-04 DIAGNOSIS — O0990 Supervision of high risk pregnancy, unspecified, unspecified trimester: Secondary | ICD-10-CM

## 2019-04-04 MED ORDER — VALACYCLOVIR HCL 500 MG PO TABS: 500.0000 mg | ORAL_TABLET | Freq: Two times a day (BID) | ORAL | 6 refills | Status: DC

## 2019-04-04 NOTE — Progress Notes (Signed)
TELEHEALTH OBSTETRICS PRENATAL VIRTUAL VIDEO VISIT ENCOUNTER NOTE  Provider location: Center for [facility] at [facility]   I connected with Robyn Miller on 04/04/19 at  2:55 PM EDT by MyChart Video Encounter at home and verified that I am speaking with the correct person using two identifiers.   I discussed the limitations, risks, security and privacy concerns of performing an evaluation and management service by telephone and the availability of in person appointments. I also discussed with the patient that there may be a patient responsible charge related to this service. The patient expressed understanding and agreed to proceed. Subjective:  Robyn Miller is a 23 y.o. G2P0010 at [redacted]w[redacted]d being seen today for ongoing prenatal care.  She is currently monitored for the following issues for this high-risk pregnancy and has Supervision of high risk pregnancy, antepartum; Preexisting hypertension complicating pregnancy, antepartum; HSV infection; Anemia affecting pregnancy in third trimester; and Scoliosis on their problem list.  Patient reports no complaints.  Contractions: Not present. Vag. Bleeding: None.  Movement: Present. Denies any leaking of fluid.   The following portions of the patient's history were reviewed and updated as appropriate: allergies, current medications, past family history, past medical history, past social history, past surgical history and problem list.   Objective:   Vitals:   04/04/19 1527  BP: 125/74  Pulse: 95    Fetal Status:     Movement: Present     General:  Alert, oriented and cooperative. Patient is in no acute distress.  Respiratory: Normal respiratory effort, no problems with respiration noted  Mental Status: Normal mood and affect. Normal behavior. Normal judgment and thought content.  Rest of physical exam deferred due to type of encounter  Imaging: [place] Mfm Fetal Bpp Wo Non Stress  Result Date: 03/29/2019  ----------------------------------------------------------------------  OBSTETRICS REPORT                        (Signed Final 03/29/2019 11:45 am) ---------------------------------------------------------------------- Patient Info  ID #:       [id number]                          D.O.B.:  [birth year] (23 yrs)  Name:       Robyn Miller                  Visit Date: 03/29/2019 10:08 am ---------------------------------------------------------------------- Performed By  Performed By:     Rennie Plowman          Ref. Address:      [address] [place],                                                              [place] [address]  Attending:  Noralee Space MD        [facility]  Referred By:      Marny Lowenstein                    [place] ---------------------------------------------------------------------- Orders   #  Description                          Code         Ordered By   1  [place] MFM FETAL BPP WO NON              76819.01     RAVI SHANKAR      STRESS   2  [place] MFM OB FOLLOW UP                  76816.01     RAVI [facility]  ----------------------------------------------------------------------   #  Order #                    Accession #                 Episode #   1  [id number]                  [id number]                  [id number]   2  [id number]                  [id number]                  [id number]  ---------------------------------------------------------------------- Indications   [redacted] weeks gestation of pregnancy                Z3A.33   Hypertension - Chronic/Pre-existing            O10.019   (labetalol and ASA)   Encounter for other antenatal screening        Z36.2   follow-up (low risk NIPS)  ---------------------------------------------------------------------- Vital Signs  Weight (lb): 205                               Height:         5'7"  BMI:         32.1 ---------------------------------------------------------------------- Fetal Evaluation  Num Of Fetuses:          1  Fetal Heart Rate(bpm):   151  Cardiac Activity:        Observed  Presentation:            Cephalic  Placenta:                Right lateral  P. Cord Insertion:       Visualized, central  Amniotic Fluid  AFI FV:      Within normal limits  AFI Sum(cm)     %Tile       Largest Pocket(cm)  11.07  26          3.98  RUQ(cm)       RLQ(cm)       LUQ(cm)        LLQ(cm)  2.92          2.02          2.15           3.98 ---------------------------------------------------------------------- Biometry  BPD:      86.9  mm     G. Age:  35w 1d         91  %    CI:        76.62   %    70 - 86                                                          FL/HC:       19.5  %    19.9 - 21.5  HC:      314.5  mm     G. Age:  35w 2d         69  %    HC/AC:       1.13       0.96 - 1.11  AC:      279.4  mm     G. Age:  32w 0d         19  %    FL/BPD:      70.7  %    71 - 87  FL:       61.4  mm     G. Age:  31w 6d         11  %    FL/AC:       22.0  %    20 - 24  HUM:        56  mm     G. Age:  32w 4d         47  %  LV:        5.6  mm  Est. FW:    1994   gm     4 lb 6 oz     24  % ---------------------------------------------------------------------- OB History  Gravidity:    2          SAB:   1 ---------------------------------------------------------------------- Gestational Age  LMP:           34w 0d        Date:  08/03/18                 EDD:   05/10/19  U/S Today:     33w 4d                                        EDD:   05/13/19  Best:          33w 1d     Det. By:  U/S  (12/07/18)          EDD:   05/16/19 ---------------------------------------------------------------------- Anatomy  Cranium:               Appears normal         LVOT:  Previously seen  Cavum:                 Appears normal         Aortic Arch:            Previously seen  Ventricles:            Appears normal          Ductal Arch:            Previously seen  Choroid Plexus:        Appears normal         Diaphragm:              Appears normal  Cerebellum:            Appears normal         Stomach:                Appears normal, left                                                                        sided  Posterior Fossa:       Appears normal         Abdomen:                Appears normal  Nuchal Fold:           Not applicable (>20    Abdominal Wall:         Appears nml (cord                         wks GA)                                        insert, abd wall)  Face:                  Profile appears        Cord Vessels:           Appears normal (3                         normal                                         vessel cord)  Lips:                  Appears normal         Kidneys:                Appear normal  Palate:                Previously seen        Bladder:                Appears normal  Thoracic:              Appears normal         Spine:  Previously seen  Heart:                 Previously seen        Upper Extremities:      Previously seen  RVOT:                  Previously seen        Lower Extremities:      Previously seen  Other:  Heels and 5th digit previously seen. Female gender previously seen.          Nasal bone visualized. ---------------------------------------------------------------------- Cervix Uterus Adnexa  Cervix  Not visualized (advanced GA >24wks)  Left Ovary  Within normal limits.  Right Ovary  Within normal limits.  Adnexa  No abnormality visualized. ---------------------------------------------------------------------- Impression  Amniotic fluid is normal and good fetal activity is seen. Fetal  growth is appropriate for gestational age. Antenatal testing is  reassuring. BPP 8/8. ---------------------------------------------------------------------- Recommendations  -Continue weekly BPP till delivery. ----------------------------------------------------------------------                   Noralee Space, MD Electronically Signed Final Report   03/29/2019 11:45 am ----------------------------------------------------------------------  [place] Mfm Ob Follow Up  Result Date: 03/29/2019 ----------------------------------------------------------------------  OBSTETRICS REPORT                        (Signed Final 03/29/2019 11:45 am) ---------------------------------------------------------------------- Patient Info  ID #:       [id number]                          D.O.B.:  12-01-1995 (23 yrs)  Name:       BUELAH RENNIE                  Visit Date: 03/29/2019 10:08 am ---------------------------------------------------------------------- Performed By  Performed By:     Rennie Plowman          Ref. Address:      [address] [place],                                                              [place] [address]  Attending:        Noralee Space MD        [facility]  Referred By:      Dimas Alexandria  [facility]                    [place] ---------------------------------------------------------------------- Orders   #  Description                          Code         Ordered By   1  [place] MFM FETAL BPP WO NON              [id number]     RAVI SHANKAR      STRESS   2  [place] MFM OB FOLLOW UP                  [id number]     RAVI SHANKAR  ----------------------------------------------------------------------   #  Order #                    Accession #                 Episode #   1  [id number]                  [id number]                  [id number]   2  [id number]                  [id number]                  [id number]  ---------------------------------------------------------------------- Indications   [redacted] weeks gestation of pregnancy                Z3A.33   Hypertension - Chronic/Pre-existing            O10.019   (labetalol and ASA)   Encounter for  other antenatal screening        Z36.2   follow-up (low risk NIPS)  ---------------------------------------------------------------------- Vital Signs  Weight (lb): 205                               Height:        5'7"  BMI:         32.1 ---------------------------------------------------------------------- Fetal Evaluation  Num Of Fetuses:          1  Fetal Heart Rate(bpm):   151  Cardiac Activity:        Observed  Presentation:            Cephalic  Placenta:                Right lateral  P. Cord Insertion:       Visualized, central  Amniotic Fluid  AFI FV:      Within normal limits  AFI Sum(cm)     %Tile       Largest Pocket(cm)  11.07           26          3.98  RUQ(cm)       RLQ(cm)       LUQ(cm)        LLQ(cm)  2.92          2.02          2.15           3.98 ---------------------------------------------------------------------- Biometry  BPD:      86.9  mm     G. Age:  35w  1d         91  %    CI:        76.62   %    70 - 86                                                          FL/HC:       19.5  %    19.9 - 21.5  HC:      314.5  mm     G. Age:  35w 2d         69  %    HC/AC:       1.13       0.96 - 1.11  AC:      279.4  mm     G. Age:  32w 0d         19  %    FL/BPD:      70.7  %    71 - 87  FL:       61.4  mm     G. Age:  31w 6d         11  %    FL/AC:       22.0  %    20 - 24  HUM:        56  mm     G. Age:  32w 4d         47  %  LV:        5.6  mm  Est. FW:    1994   gm     4 lb 6 oz     24  % ---------------------------------------------------------------------- OB History  Gravidity:    2          SAB:   1 ---------------------------------------------------------------------- Gestational Age  LMP:           34w 0d        Date:  08/03/18                 EDD:   05/10/19  U/S Today:     33w 4d                                        EDD:   05/13/19  Best:          33w 1d     Det. By:  U/S  (12/07/18)          EDD:   05/16/19 ---------------------------------------------------------------------- Anatomy   Cranium:               Appears normal         LVOT:                   Previously seen  Cavum:                 Appears normal         Aortic Arch:            Previously seen  Ventricles:            Appears normal         Ductal Arch:  Previously seen  Choroid Plexus:        Appears normal         Diaphragm:              Appears normal  Cerebellum:            Appears normal         Stomach:                Appears normal, left                                                                        sided  Posterior Fossa:       Appears normal         Abdomen:                Appears normal  Nuchal Fold:           Not applicable (>20    Abdominal Wall:         Appears nml (cord                         wks GA)                                        insert, abd wall)  Face:                  Profile appears        Cord Vessels:           Appears normal (3                         normal                                         vessel cord)  Lips:                  Appears normal         Kidneys:                Appear normal  Palate:                Previously seen        Bladder:                Appears normal  Thoracic:              Appears normal         Spine:                  Previously seen  Heart:                 Previously seen        Upper Extremities:      Previously seen  RVOT:                  Previously seen        Lower  Extremities:      Previously seen  Other:  Heels and 5th digit previously seen. Female gender previously seen.          Nasal bone visualized. ---------------------------------------------------------------------- Cervix Uterus Adnexa  Cervix  Not visualized (advanced GA >24wks)  Left Ovary  Within normal limits.  Right Ovary  Within normal limits.  Adnexa  No abnormality visualized. ---------------------------------------------------------------------- Impression  Amniotic fluid is normal and good fetal activity is seen. Fetal  growth is appropriate for gestational age. Antenatal testing is   reassuring. BPP 8/8. ---------------------------------------------------------------------- Recommendations  -Continue weekly BPP till delivery. ----------------------------------------------------------------------                  Noralee Spaceavi Shankar, MD Electronically Signed Final Report   03/29/2019 11:45 am ----------------------------------------------------------------------   Assessment and Plan:  Pregnancy: G2P0010 at 64103w0d 1. Supervision of high risk pregnancy, antepartum Stable GBS with next visit  2. Preexisting hypertension complicating pregnancy, antepartum BP stable Continue with current management Antenatal testing scheduled  3. HSV infection Will start suppression at 36  - valACYclovir (VALTREX) 500 MG tablet; Take 1 tablet (500 mg total) by mouth 2 (two) times daily.  Dispense: 60 tablet; Refill: 6  Preterm labor symptoms and general obstetric precautions including but not limited to vaginal bleeding, contractions, leaking of fluid and fetal movement were reviewed in detail with the patient. I discussed the assessment and treatment plan with the patient. The patient was provided an opportunity to ask questions and all were answered. The patient agreed with the plan and demonstrated an understanding of the instructions. The patient was advised to call back or seek an in-person office evaluation/[facility] for any urgent or concerning symptoms. Please refer to After Visit Summary for other counseling recommendations.   I provided 8 minutes of face-to-face time during this encounter.  Return in about 2 weeks (around 04/18/2019) for OB visit, face to face for GBS.  Future Appointments  Date Time Provider Department Center  04/08/2019 10:45 AM WH-MFC NURSE WH-MFC MFC-US  04/08/2019 10:45 AM WH-MFC US 2 WH-MFCUS MFC-US  04/12/2019  9:30 AM WH-MFC NURSE WH-MFC MFC-US  04/12/2019  9:30 AM WH-MFC US 5 WH-MFCUS MFC-US    Hermina StaggersMichael L Milcah Dulany, MD Center for  [facility], [facility]

## 2019-04-08 ENCOUNTER — Ambulatory Visit (HOSPITAL_COMMUNITY)
Admission: RE | Admit: 2019-04-08 | Discharge: 2019-04-08 | Disposition: A | Discharge status: Home / Self Care | Payer: Medicaid Other | Source: Ambulatory Visit | Attending: Obstetrics and Gynecology | Admitting: Obstetrics and Gynecology

## 2019-04-08 ENCOUNTER — Ambulatory Visit (HOSPITAL_COMMUNITY): Payer: Medicaid Other

## 2019-04-08 ENCOUNTER — Other Ambulatory Visit: Payer: Self-pay

## 2019-04-08 DIAGNOSIS — O10913 Unspecified pre-existing hypertension complicating pregnancy, third trimester: Secondary | ICD-10-CM

## 2019-04-08 DIAGNOSIS — Z3A34 34 weeks gestation of pregnancy: Secondary | ICD-10-CM

## 2019-04-08 DIAGNOSIS — O10919 Unspecified pre-existing hypertension complicating pregnancy, unspecified trimester: Secondary | ICD-10-CM | POA: Insufficient documentation

## 2019-04-12 ENCOUNTER — Ambulatory Visit (HOSPITAL_COMMUNITY)
Admission: RE | Admit: 2019-04-12 | Discharge: 2019-04-12 | Disposition: A | Discharge status: Home / Self Care | Payer: Medicaid Other | Source: Ambulatory Visit | Attending: Obstetrics and Gynecology | Admitting: Obstetrics and Gynecology

## 2019-04-12 ENCOUNTER — Other Ambulatory Visit (HOSPITAL_COMMUNITY): Payer: Self-pay | Admitting: *Deleted

## 2019-04-12 ENCOUNTER — Ambulatory Visit (HOSPITAL_COMMUNITY): Payer: Medicaid Other | Admitting: *Deleted

## 2019-04-12 ENCOUNTER — Other Ambulatory Visit: Payer: Self-pay

## 2019-04-12 ENCOUNTER — Encounter (HOSPITAL_COMMUNITY): Payer: Self-pay

## 2019-04-12 VITALS — BP 127/70 | HR 91 | Temp 98.6°F

## 2019-04-12 DIAGNOSIS — O10919 Unspecified pre-existing hypertension complicating pregnancy, unspecified trimester: Secondary | ICD-10-CM | POA: Diagnosis not present

## 2019-04-12 DIAGNOSIS — O10013 Pre-existing essential hypertension complicating pregnancy, third trimester: Secondary | ICD-10-CM

## 2019-04-12 DIAGNOSIS — Z3A35 35 weeks gestation of pregnancy: Secondary | ICD-10-CM | POA: Diagnosis not present

## 2019-04-19 ENCOUNTER — Other Ambulatory Visit: Payer: Self-pay

## 2019-04-19 ENCOUNTER — Ambulatory Visit (HOSPITAL_COMMUNITY)
Admission: RE | Admit: 2019-04-19 | Discharge: 2019-04-19 | Disposition: A | Discharge status: Home / Self Care | Payer: Medicaid Other | Source: Ambulatory Visit | Attending: Obstetrics and Gynecology | Admitting: Obstetrics and Gynecology

## 2019-04-19 ENCOUNTER — Ambulatory Visit (HOSPITAL_COMMUNITY): Payer: Medicaid Other | Admitting: *Deleted

## 2019-04-19 ENCOUNTER — Encounter (HOSPITAL_COMMUNITY): Payer: Self-pay

## 2019-04-19 VITALS — BP 132/75 | HR 95 | Temp 98.6°F

## 2019-04-19 DIAGNOSIS — O10919 Unspecified pre-existing hypertension complicating pregnancy, unspecified trimester: Secondary | ICD-10-CM

## 2019-04-19 DIAGNOSIS — Z3A36 36 weeks gestation of pregnancy: Secondary | ICD-10-CM

## 2019-04-19 DIAGNOSIS — O10913 Unspecified pre-existing hypertension complicating pregnancy, third trimester: Secondary | ICD-10-CM

## 2019-04-22 ENCOUNTER — Other Ambulatory Visit (HOSPITAL_COMMUNITY)
Admission: RE | Admit: 2019-04-22 | Discharge: 2019-04-22 | Disposition: A | Discharge status: Home / Self Care | Payer: Medicaid Other | Source: Ambulatory Visit | Attending: Obstetrics & Gynecology | Admitting: Obstetrics & Gynecology

## 2019-04-22 ENCOUNTER — Encounter: Payer: Self-pay | Admitting: Obstetrics & Gynecology

## 2019-04-22 ENCOUNTER — Other Ambulatory Visit: Payer: Self-pay

## 2019-04-22 ENCOUNTER — Ambulatory Visit (INDEPENDENT_AMBULATORY_CARE_PROVIDER_SITE_OTHER): Payer: Medicaid Other | Admitting: Obstetrics & Gynecology

## 2019-04-22 VITALS — BP 120/69 | HR 105 | Wt 213.4 lb

## 2019-04-22 DIAGNOSIS — O10913 Unspecified pre-existing hypertension complicating pregnancy, third trimester: Secondary | ICD-10-CM

## 2019-04-22 DIAGNOSIS — O0990 Supervision of high risk pregnancy, unspecified, unspecified trimester: Secondary | ICD-10-CM | POA: Insufficient documentation

## 2019-04-22 DIAGNOSIS — O10919 Unspecified pre-existing hypertension complicating pregnancy, unspecified trimester: Secondary | ICD-10-CM

## 2019-04-22 DIAGNOSIS — O0993 Supervision of high risk pregnancy, unspecified, third trimester: Secondary | ICD-10-CM

## 2019-04-22 DIAGNOSIS — Z3A36 36 weeks gestation of pregnancy: Secondary | ICD-10-CM

## 2019-04-22 LAB — OB RESULTS CONSOLE GC/CHLAMYDIA: Gonorrhea: NEGATIVE

## 2019-04-22 LAB — OB RESULTS CONSOLE GBS: GBS: NEGATIVE

## 2019-04-22 NOTE — Progress Notes (Signed)
   PRENATAL VISIT NOTE  Subjective:  Robyn Miller is a 23 y.o. G2P0010 at [redacted]w[redacted]d being seen today for ongoing prenatal care.  She is currently monitored for the following issues for this high-risk pregnancy and has Supervision of high risk pregnancy, antepartum; Preexisting hypertension complicating pregnancy, antepartum; HSV infection; Anemia affecting pregnancy in third trimester; and Scoliosis on their problem list.  Patient reports no complaints.  Contractions: Not present. Vag. Bleeding: None.  Movement: Present. Denies leaking of fluid.   The following portions of the patient's history were reviewed and updated as appropriate: allergies, current medications, past family history, past medical history, past social history, past surgical history and problem list.   Objective:   Vitals:   04/22/19 1043  BP: 120/69  Pulse: (!) 105  Weight: 213 lb 6.4 oz (96.8 kg)    Fetal Status:     Movement: Present     General:  Alert, oriented and cooperative. Patient is in no acute distress.  Skin: Skin is warm and dry. No rash noted.   Cardiovascular: Normal heart rate noted  Respiratory: Normal respiratory effort, no problems with respiration noted  Abdomen: Soft, gravid, appropriate for gestational age.  Pain/Pressure: Present     Pelvic: Cervical exam deferred        Extremities: Normal range of motion.  Edema: None  Mental Status: Normal mood and affect. Normal behavior. Normal judgment and thought content.   Assessment and Plan:  Pregnancy: G2P0010 at [redacted]w[redacted]d 1. Preexisting hypertension complicating pregnancy, antepartum Stable BP on Labetalol. Continue antenatal testing.  Will schedule IOL at next visit; will be at 39 weeks.  2. Supervision of high risk pregnancy, antepartum Pelvic cultures - Culture, beta strep (group b only) - GC/Chlamydia probe amp ([place])not at [facility] Preterm labor symptoms and general obstetric precautions including but not limited to vaginal bleeding,  contractions, leaking of fluid and fetal movement were reviewed in detail with the patient. Please refer to After Visit Summary for other counseling recommendations.   Return in about 11 days (around 05/03/2019) for OFFICE [facility] Visit (after ultrasound).  Future Appointments  Date Time Provider [place]  04/26/2019  9:15 AM WH-MFC [place] 4 WH-MFCUS MFC-US  04/26/2019  9:20 AM WH-MFC NURSE [place] MFC-US  05/03/2019  8:35 AM Aletha Halim, MD WOC-WOCA WOC  05/03/2019  9:30 AM WH-MFC NURSE [place] MFC-US  05/03/2019  9:30 AM [place] 5 WH-MFCUS MFC-US    Verita Schneiders, MD

## 2019-04-22 NOTE — Patient Instructions (Signed)
Return to office for any scheduled appointments. Call the office or go to the [facility] [place] if:  You begin to have strong, frequent contractions  Your water breaks.  Sometimes it is a big gush of fluid, sometimes it is just a trickle that keeps getting your panties wet or running down your legs  You have vaginal bleeding.  It is normal to have a small amount of spotting if your cervix was checked.   You do not feel your baby moving like normal.  If you do not, get something to eat and drink and lay down and focus on feeling your baby move.   If your baby is still not moving like normal, you should call the office or go to MAU.  Any other obstetric concerns.   

## 2019-04-23 LAB — GC/CHLAMYDIA PROBE AMP (~~LOC~~) NOT AT ARMC
Chlamydia: NEGATIVE
Neisseria Gonorrhea: NEGATIVE

## 2019-04-26 ENCOUNTER — Other Ambulatory Visit: Payer: Self-pay

## 2019-04-26 ENCOUNTER — Ambulatory Visit (HOSPITAL_COMMUNITY): Payer: Medicaid Other | Admitting: *Deleted

## 2019-04-26 ENCOUNTER — Ambulatory Visit (HOSPITAL_COMMUNITY)
Admission: RE | Admit: 2019-04-26 | Discharge: 2019-04-26 | Disposition: A | Discharge status: Home / Self Care | Payer: Medicaid Other | Source: Ambulatory Visit | Attending: Obstetrics and Gynecology | Admitting: Obstetrics and Gynecology

## 2019-04-26 ENCOUNTER — Encounter (HOSPITAL_COMMUNITY): Payer: Self-pay

## 2019-04-26 VITALS — BP 139/97 | Temp 98.2°F

## 2019-04-26 DIAGNOSIS — O10919 Unspecified pre-existing hypertension complicating pregnancy, unspecified trimester: Secondary | ICD-10-CM

## 2019-04-26 DIAGNOSIS — O10013 Pre-existing essential hypertension complicating pregnancy, third trimester: Secondary | ICD-10-CM

## 2019-04-26 DIAGNOSIS — Z3A37 37 weeks gestation of pregnancy: Secondary | ICD-10-CM | POA: Diagnosis not present

## 2019-04-26 DIAGNOSIS — Z362 Encounter for other antenatal screening follow-up: Secondary | ICD-10-CM | POA: Diagnosis not present

## 2019-04-26 LAB — CULTURE, BETA STREP (GROUP B ONLY): Strep Gp B Culture: NEGATIVE

## 2019-05-03 ENCOUNTER — Inpatient Hospital Stay (HOSPITAL_COMMUNITY)
Admission: AD | Admit: 2019-05-03 | Discharge: 2019-05-06 | DRG: 806 | Disposition: A | Discharge status: Home / Self Care | Payer: Medicaid Other | Attending: Obstetrics and Gynecology | Admitting: Obstetrics and Gynecology

## 2019-05-03 ENCOUNTER — Ambulatory Visit (HOSPITAL_COMMUNITY)
Admission: RE | Admit: 2019-05-03 | Discharge: 2019-05-03 | Disposition: A | Discharge status: Home / Self Care | Payer: Medicaid Other | Source: Ambulatory Visit | Attending: Obstetrics and Gynecology | Admitting: Obstetrics and Gynecology

## 2019-05-03 ENCOUNTER — Ambulatory Visit (INDEPENDENT_AMBULATORY_CARE_PROVIDER_SITE_OTHER): Payer: Medicaid Other | Admitting: Obstetrics and Gynecology

## 2019-05-03 ENCOUNTER — Encounter (HOSPITAL_COMMUNITY): Payer: Self-pay

## 2019-05-03 ENCOUNTER — Other Ambulatory Visit: Payer: Self-pay

## 2019-05-03 ENCOUNTER — Telehealth (HOSPITAL_COMMUNITY): Payer: Self-pay | Admitting: *Deleted

## 2019-05-03 ENCOUNTER — Ambulatory Visit (HOSPITAL_COMMUNITY): Payer: Medicaid Other | Admitting: *Deleted

## 2019-05-03 ENCOUNTER — Encounter (HOSPITAL_COMMUNITY): Payer: Self-pay | Admitting: *Deleted

## 2019-05-03 VITALS — BP 132/80 | HR 112 | Temp 97.8°F | Wt 220.9 lb

## 2019-05-03 DIAGNOSIS — O98813 Other maternal infectious and parasitic diseases complicating pregnancy, third trimester: Secondary | ICD-10-CM

## 2019-05-03 DIAGNOSIS — O10913 Unspecified pre-existing hypertension complicating pregnancy, third trimester: Secondary | ICD-10-CM

## 2019-05-03 DIAGNOSIS — O4103X1 Oligohydramnios, third trimester, fetus 1: Secondary | ICD-10-CM | POA: Diagnosis not present

## 2019-05-03 DIAGNOSIS — O0990 Supervision of high risk pregnancy, unspecified, unspecified trimester: Secondary | ICD-10-CM

## 2019-05-03 DIAGNOSIS — O4103X0 Oligohydramnios, third trimester, not applicable or unspecified: Secondary | ICD-10-CM | POA: Diagnosis not present

## 2019-05-03 DIAGNOSIS — Z3A38 38 weeks gestation of pregnancy: Secondary | ICD-10-CM

## 2019-05-03 DIAGNOSIS — B009 Herpesviral infection, unspecified: Secondary | ICD-10-CM | POA: Diagnosis present

## 2019-05-03 DIAGNOSIS — O10013 Pre-existing essential hypertension complicating pregnancy, third trimester: Secondary | ICD-10-CM

## 2019-05-03 DIAGNOSIS — A6000 Herpesviral infection of urogenital system, unspecified: Secondary | ICD-10-CM | POA: Diagnosis present

## 2019-05-03 DIAGNOSIS — O9832 Other infections with a predominantly sexual mode of transmission complicating childbirth: Secondary | ICD-10-CM | POA: Diagnosis present

## 2019-05-03 DIAGNOSIS — O9902 Anemia complicating childbirth: Secondary | ICD-10-CM | POA: Diagnosis present

## 2019-05-03 DIAGNOSIS — D649 Anemia, unspecified: Secondary | ICD-10-CM | POA: Diagnosis present

## 2019-05-03 DIAGNOSIS — M419 Scoliosis, unspecified: Secondary | ICD-10-CM | POA: Diagnosis present

## 2019-05-03 DIAGNOSIS — O10919 Unspecified pre-existing hypertension complicating pregnancy, unspecified trimester: Secondary | ICD-10-CM

## 2019-05-03 DIAGNOSIS — O1002 Pre-existing essential hypertension complicating childbirth: Secondary | ICD-10-CM | POA: Diagnosis present

## 2019-05-03 DIAGNOSIS — Z20828 Contact with and (suspected) exposure to other viral communicable diseases: Secondary | ICD-10-CM | POA: Diagnosis present

## 2019-05-03 DIAGNOSIS — O0993 Supervision of high risk pregnancy, unspecified, third trimester: Secondary | ICD-10-CM

## 2019-05-03 DIAGNOSIS — O99013 Anemia complicating pregnancy, third trimester: Secondary | ICD-10-CM

## 2019-05-03 DIAGNOSIS — O4100X0 Oligohydramnios, unspecified trimester, not applicable or unspecified: Secondary | ICD-10-CM | POA: Diagnosis present

## 2019-05-03 DIAGNOSIS — D509 Iron deficiency anemia, unspecified: Secondary | ICD-10-CM

## 2019-05-03 DIAGNOSIS — O164 Unspecified maternal hypertension, complicating childbirth: Secondary | ICD-10-CM | POA: Diagnosis not present

## 2019-05-03 DIAGNOSIS — O26893 Other specified pregnancy related conditions, third trimester: Secondary | ICD-10-CM | POA: Diagnosis present

## 2019-05-03 DIAGNOSIS — I10 Essential (primary) hypertension: Secondary | ICD-10-CM | POA: Diagnosis present

## 2019-05-03 DIAGNOSIS — O1092 Unspecified pre-existing hypertension complicating childbirth: Secondary | ICD-10-CM | POA: Diagnosis not present

## 2019-05-03 LAB — POCT URINALYSIS DIP (DEVICE)
Bilirubin Urine: NEGATIVE
Glucose, UA: NEGATIVE mg/dL
Hgb urine dipstick: NEGATIVE
Ketones, ur: NEGATIVE mg/dL
Nitrite: NEGATIVE
Protein, ur: NEGATIVE mg/dL
Specific Gravity, Urine: 1.02 (ref 1.005–1.030)
Urobilinogen, UA: 0.2 mg/dL (ref 0.0–1.0)
pH: 7 (ref 5.0–8.0)

## 2019-05-03 LAB — TYPE AND SCREEN
ABO/RH(D): A POS
Antibody Screen: NEGATIVE

## 2019-05-03 LAB — CBC
HCT: 37.5 % (ref 36.0–46.0)
Hemoglobin: 11.8 g/dL — ABNORMAL LOW (ref 12.0–15.0)
MCH: 27.3 pg (ref 26.0–34.0)
MCHC: 31.5 g/dL (ref 30.0–36.0)
MCV: 86.6 fL (ref 80.0–100.0)
Platelets: 224 10*3/uL (ref 150–400)
RBC: 4.33 MIL/uL (ref 3.87–5.11)
RDW: 18.6 % — ABNORMAL HIGH (ref 11.5–15.5)
WBC: 9.9 10*3/uL (ref 4.0–10.5)
nRBC: 0 % (ref 0.0–0.2)

## 2019-05-03 LAB — PROTEIN / CREATININE RATIO, URINE
Creatinine, Urine: 81.07 mg/dL
Protein Creatinine Ratio: 0.15 mg/mg{Cre} (ref 0.00–0.15)
Total Protein, Urine: 12 mg/dL

## 2019-05-03 LAB — COMPREHENSIVE METABOLIC PANEL
ALT: 12 U/L (ref 0–44)
AST: 23 U/L (ref 15–41)
Albumin: 3 g/dL — ABNORMAL LOW (ref 3.5–5.0)
Alkaline Phosphatase: 127 U/L — ABNORMAL HIGH (ref 38–126)
Anion gap: 10 (ref 5–15)
BUN: <5 mg/dL — ABNORMAL LOW (ref 6–20)
CO2: 20 mmol/L — ABNORMAL LOW (ref 22–32)
Calcium: 9 mg/dL (ref 8.9–10.3)
Chloride: 108 mmol/L (ref 98–111)
Creatinine, Ser: 0.64 mg/dL (ref 0.44–1.00)
GFR calc Af Amer: >60 mL/min (ref >60)
GFR calc non Af Amer: >60 mL/min (ref >60)
Glucose, Bld: 106 mg/dL — ABNORMAL HIGH (ref 70–99)
Potassium: 3.7 mmol/L (ref 3.5–5.1)
Sodium: 138 mmol/L (ref 135–145)
Total Bilirubin: 0.3 mg/dL (ref 0.3–1.2)
Total Protein: 6.7 g/dL (ref 6.5–8.1)

## 2019-05-03 LAB — SARS CORONAVIRUS 2 BY RT PCR (HOSPITAL ORDER, PERFORMED IN ~~LOC~~ HOSPITAL LAB): SARS Coronavirus 2: NEGATIVE

## 2019-05-03 LAB — ABO/RH: ABO/RH(D): A POS

## 2019-05-03 MED ORDER — ACETAMINOPHEN 325 MG PO TABS: 650.0000 mg | ORAL_TABLET | ORAL | Status: DC | PRN

## 2019-05-03 MED ORDER — LABETALOL HCL 200 MG PO TABS
200.0000 mg | ORAL_TABLET | Freq: Two times a day (BID) | ORAL | Status: DC
  Administered 2019-05-03 – 2019-05-04 (×3): 200 mg via ORAL
  Filled 2019-05-03 (×3): qty 1

## 2019-05-03 MED ORDER — ONDANSETRON HCL 4 MG/2ML IJ SOLN: 4.0000 mg | Freq: Four times a day (QID) | INTRAMUSCULAR | Status: DC | PRN

## 2019-05-03 MED ORDER — LIDOCAINE HCL (PF) 1 % IJ SOLN: 30.0000 mL | INTRAMUSCULAR | Status: DC | PRN

## 2019-05-03 MED ORDER — SOD CITRATE-CITRIC ACID 500-334 MG/5ML PO SOLN: 30.0000 mL | ORAL | Status: DC | PRN

## 2019-05-03 MED ORDER — LACTATED RINGERS IV SOLN
INTRAVENOUS | Status: DC
  Administered 2019-05-03 – 2019-05-04 (×5): via INTRAVENOUS

## 2019-05-03 MED ORDER — LACTATED RINGERS IV SOLN
500.0000 mL | INTRAVENOUS | Status: DC | PRN
  Administered 2019-05-04: 500 mL via INTRAVENOUS

## 2019-05-03 MED ORDER — MISOPROSTOL 50MCG HALF TABLET
50.0000 ug | ORAL_TABLET | ORAL | Status: DC | PRN
  Administered 2019-05-03 – 2019-05-04 (×3): 50 ug via ORAL
  Filled 2019-05-03 (×3): qty 1

## 2019-05-03 MED ORDER — OXYTOCIN 40 UNITS IN NORMAL SALINE INFUSION - SIMPLE MED
2.5000 [IU]/h | INTRAVENOUS | Status: DC
  Administered 2019-05-05: 2.5 [IU]/h via INTRAVENOUS

## 2019-05-03 MED ORDER — OXYTOCIN BOLUS FROM INFUSION
500.0000 mL | Freq: Once | INTRAVENOUS | Status: AC
  Administered 2019-05-04: 500 mL via INTRAVENOUS

## 2019-05-03 MED ORDER — VALACYCLOVIR HCL 500 MG PO TABS
500.0000 mg | ORAL_TABLET | Freq: Two times a day (BID) | ORAL | Status: DC
  Administered 2019-05-03 – 2019-05-04 (×2): 500 mg via ORAL
  Filled 2019-05-03 (×2): qty 1

## 2019-05-03 MED ORDER — TERBUTALINE SULFATE 1 MG/ML IJ SOLN: 0.2500 mg | Freq: Once | INTRAMUSCULAR | Status: DC | PRN

## 2019-05-03 NOTE — Progress Notes (Signed)
Robyn Miller 23 y.o. G2P0010 at [redacted]w[redacted]d. Admission for [place] and CHTN  Report given to labor team who assume care of patient at this time.  Mallie Snooks, MSN, CNM Certified Nurse Midwife, Faculty Practice 05/03/19 6:55 PM

## 2019-05-03 NOTE — Progress Notes (Signed)
Prenatal Visit Note Date: 05/03/2019 [facility]: [facility]  Subjective:  Robyn Miller is a 23 y.o. G2P0010 at [redacted]w[redacted]d being seen today for ongoing prenatal care.  She is currently monitored for the following issues for this high-risk pregnancy and has Supervision of high risk pregnancy, antepartum; Preexisting hypertension complicating pregnancy, antepartum; HSV infection; Anemia affecting pregnancy in third trimester; and Scoliosis on their problem list.  Patient reports no complaints.   Contractions: Not present. Vag. Bleeding: None.  Movement: Present. Denies leaking of fluid.   The following portions of the patient's history were reviewed and updated as appropriate: allergies, current medications, past family history, past medical history, past social history, past surgical history and problem list. Problem list updated.  Objective:   Vitals:   05/03/19 0849  BP: 132/80  Pulse: (!) 112  Temp: 97.8 F (36.6 C)  Weight: 220 lb 14.4 oz (100.2 kg)    Fetal Status: Fetal Heart Rate (bpm): 148   Movement: Present     General:  Alert, oriented and cooperative. Patient is in no acute distress.  Skin: Skin is warm and dry. No rash noted.   Cardiovascular: Normal heart rate noted  Respiratory: Normal respiratory effort, no problems with respiration noted  Abdomen: Soft, gravid, appropriate for gestational age. Pain/Pressure: Present     Pelvic:  Cervical exam deferred        Extremities: Normal range of motion.  Edema: None  Mental Status: Normal mood and affect. Normal behavior. Normal judgment and thought content.   Urinalysis:      Assessment and Plan:  Pregnancy: G2P0010 at [redacted]w[redacted]d  1. Supervision of high risk pregnancy, antepartum Routine care.  2. Preexisting hypertension complicating pregnancy, antepartum Set up for IOL on 8/6. On labetalol 200 bid. Has nst later today  3. HSV infection Continue suppression  term labor symptoms and general obstetric  precautions including but not limited to vaginal bleeding, contractions, leaking of fluid and fetal movement were reviewed in detail with the patient. Please refer to After Visit Summary for other counseling recommendations.  No follow-ups on file.   Aletha Halim, MD

## 2019-05-03 NOTE — Telephone Encounter (Signed)
Preadmission screen  

## 2019-05-03 NOTE — Progress Notes (Signed)
LABOR PROGRESS NOTE  Robyn Miller is a 23 y.o. G2P0010 at [redacted]w[redacted]d  admitted for IOL due to [facility] and oligohydramnios.   Subjective: She is doing well resting in bed. She does not have any discomfort at this time.   Objective: BP (!) 143/88   Pulse 97   Temp 98.4 F (36.9 C) (Oral)   Resp 18   Ht 5\' 9"  (1.753 m)   Wt 100.2 kg   LMP 08/03/2018 (Within Days)   SpO2 98% Comment: room air   BMI 32.62 kg/m  or  Vitals:   05/03/19 1831 05/03/19 1902 05/03/19 1944 05/03/19 1948  BP: 130/87 (!) 142/86  (!) 143/88  Pulse: (!) 114 (!) 107  97  Resp:  16  18  Temp:  98.6 F (37 C)  98.4 F (36.9 C)  TempSrc:  Axillary  Oral  SpO2:  98%    Weight:   100.2 kg   Height:   5\' 9"  (1.753 m)      Dilation: 1 Effacement (%): Thick Station: -3 Presentation: Vertex Exam by:: Sylvester Harder MD FHT: baseline rate 140 bpm, moderate varibility, 15 x 15 acel, no decel Toco: minimal  Labs: Lab Results  Component Value Date   WBC 9.9 05/03/2019   HGB 11.8 (L) 05/03/2019   HCT 37.5 05/03/2019   MCV 86.6 05/03/2019   PLT 224 05/03/2019    Patient Active Problem List   Diagnosis Date Noted  . Scoliosis 03/13/2019  . Anemia affecting pregnancy in third trimester 02/14/2019  . Supervision of high risk pregnancy, antepartum 10/23/2018  . Preexisting hypertension complicating pregnancy, antepartum 10/23/2018  . HSV infection 10/23/2018    Assessment / Plan: 23 y.o. G2P0010 at [redacted]w[redacted]d here for IOL due to cHTN and oligohydramnios. Blood pressure is well controlled at this time with Labetalol 200mg  BID.  Labor: Buccal cytotec x1. FB attempted. Consider FB after second dose of cytotec. Continue cervical exams for progression of labor. Fetal Wellbeing:  Cat I Pain Control:  Maternally supported Anticipated MOD:  Vaginal  Anniyah Mood Autry-Lott, D.O. Family Medicine Resident, PGY-1 05/03/2019, 8:33 PM

## 2019-05-03 NOTE — MAU Note (Signed)
.   Robyn Miller is a 23 y.o. at [redacted]w[redacted]d here in MAU for scheduled induction. Awaiting for bed in L&D. Pt has no complaints at this time   Pain score: 0 Vitals:   05/03/19 1322 05/03/19 1334  BP: (!) 167/86   Pulse: (!) 130   Resp: 16 16  Temp:  97.9 F (36.6 C)  SpO2:  100%     FHT:150 Lab orders placed from triage:

## 2019-05-03 NOTE — Progress Notes (Signed)
OB/GYN Faculty Practice: Labor Progress Note  Subjective: Patient doing well without complaints. No ctx, LOF, vaginal bleeding. +FM.  Objective: BP (!) 142/86 (BP Location: Left Arm)   Pulse (!) 107   Temp 98.6 F (37 C) (Axillary)   Resp 16   LMP 08/03/2018 (Within Days)   SpO2 98% Comment: room air  Gen: well appearing, NAD Dilation: 1 Effacement (%): Thick Station: -3 Presentation: Vertex Exam by:: Sylvester Harder MD SSE: no active lesions noted  Assessment and Plan: 23 y.o. G2P0010 [redacted]w[redacted]d presents for IOL-oligo/BPP 6/8.  Labor: will start induction with cytotec, continue cervical exams as appropriate. Consider FB placement when able.  -- pain control: desires epidural -- PPH Risk: low  Fetal Well-Being: EFW 2858g (30%ile) on 04/26/19. Cephalic by cervical exam.  -- Category 1 - continuous fetal monitoring  -- GBS negative  HSV --on valtrex suppression 500 mg BID, ordered --no active lesions noted on SSE upon admission  Scoliosis --anesthesia consult, message sent  cHTN --on labetalol 200 mg BID --PIH labs unremarkable --blood pressure elevated on admission, have been SBP 140s since --continue home bp regimen, continue to monitor  Corliss Blacker, [place] 7:33 PM

## 2019-05-03 NOTE — Progress Notes (Signed)
Scheduled IOL 8/6 @ 730

## 2019-05-03 NOTE — H&P (Addendum)
Robyn Miller is a 23 y.o. female presenting for IOL for CHTN and Oligohydramnios. Her blood pressure has been well controlled on Labetalol 200mg  BID. She is s/p BPP 6/5/8 this morning. Upon admission she denies vaginal bleeding, leaking of fluid, decreased fetal movement, fever, falls, or recent illness. She also denies headache, visual disturbances, RUQ/upper abdominal pain, new onset swelling or weight gain.  Prenatal History [facility] Dating by LMP NIPS low risk female Rubella immune A POS TDAP 02/13/19   OB History    Gravida  2   Para      Term      Preterm      AB  1   Living        SAB  1   TAB      Ectopic      Multiple      Live Births             Past Medical History:  Diagnosis Date  . Abscess   . Anemia   . Herpes   . Hypertension   . Scoliosis    Past Surgical History:  Procedure Laterality Date  . WISDOM TOOTH EXTRACTION     Family History: family history includes Hypertension in her maternal grandmother; Scoliosis in her mother. Social History:  reports that she has never smoked. She has never used smokeless tobacco. She reports previous alcohol use. She reports previous drug use. Drug: Marijuana.     Maternal Diabetes: No Genetic Screening: Normal Maternal Ultrasounds/Referrals: Normal Fetal Ultrasounds or other Referrals:  None Maternal Substance Abuse:  No Significant Maternal Medications:  None Significant Maternal Lab Results:  Group B Strep negative Other Comments:  None  Review of Systems  Constitutional: Negative for chills and fever.  Respiratory: Negative for shortness of breath.   Cardiovascular: Negative for chest pain.  Gastrointestinal: Negative for abdominal pain.  Musculoskeletal: Negative for back pain.  Neurological: Negative for headaches.  All other systems reviewed and are negative.    Last menstrual period 08/03/2018. Physical Exam  Nursing note and vitals reviewed. Constitutional: She is oriented to  person, place, and time. She appears well-developed and well-nourished.  Cardiovascular: Normal rate.  Respiratory: Effort normal and breath sounds normal. No respiratory distress.  GI: She exhibits no distension. There is no abdominal tenderness. There is no rebound and no guarding.  Gravid  Musculoskeletal: Normal range of motion.  Neurological: She is alert and oriented to person, place, and time.  Skin: Skin is warm and dry.  Psychiatric: She has a normal mood and affect. Her behavior is normal. Judgment and thought content normal.    Prenatal labs: ABO, Rh: A/Positive/-- (01/21 1458) Antibody: Negative (01/21 1458) Rubella: 4.16 (01/21 1458) RPR: Non Reactive (05/13 0901)  HBsAg: Negative (01/21 1458)  HIV: Non Reactive (05/13 0901)  GBS: Negative (07/20 0000)   Fetal Surveillance Category I tracing: baseline 150, mod var, + accels, no decels Toco: irregular ctx q 2-31min, not felt by patient, palpate mild EFW 2858g/30% at 37w 1d  Assessment/Plan: --23 y.o. G2P0010 at [redacted]w[redacted]d  --Oligohydramnios --Chronic Hypertension, on medication, PEC labs ordered --Severe BP x 1 in triage, at baseline on recheck. No severe symptoms --Category I tracing --HSV on suppression --Desires epidural --Cervical exam deferred to labor team. Pt admitted from MAU due to lack of bed space on L&D. --girl/breast/OCPs  Darlina Rumpf, CNM 05/03/2019, 1:37 PM

## 2019-05-04 ENCOUNTER — Inpatient Hospital Stay (HOSPITAL_COMMUNITY): Payer: Medicaid Other | Admitting: Anesthesiology

## 2019-05-04 DIAGNOSIS — O1092 Unspecified pre-existing hypertension complicating childbirth: Secondary | ICD-10-CM

## 2019-05-04 DIAGNOSIS — O4100X0 Oligohydramnios, unspecified trimester, not applicable or unspecified: Secondary | ICD-10-CM | POA: Diagnosis present

## 2019-05-04 LAB — CBC
HCT: 38.8 % (ref 36.0–46.0)
Hemoglobin: 12.3 g/dL (ref 12.0–15.0)
MCH: 27.6 pg (ref 26.0–34.0)
MCHC: 31.7 g/dL (ref 30.0–36.0)
MCV: 87 fL (ref 80.0–100.0)
Platelets: 219 10*3/uL (ref 150–400)
RBC: 4.46 MIL/uL (ref 3.87–5.11)
RDW: 18.6 % — ABNORMAL HIGH (ref 11.5–15.5)
WBC: 13.7 10*3/uL — ABNORMAL HIGH (ref 4.0–10.5)
nRBC: 0 % (ref 0.0–0.2)

## 2019-05-04 LAB — RPR: RPR Ser Ql: NONREACTIVE

## 2019-05-04 MED ORDER — OXYTOCIN 40 UNITS IN NORMAL SALINE INFUSION - SIMPLE MED
1.0000 m[IU]/min | INTRAVENOUS | Status: DC
  Administered 2019-05-04: 2 m[IU]/min via INTRAVENOUS
  Filled 2019-05-04: qty 1000

## 2019-05-04 MED ORDER — LABETALOL HCL 5 MG/ML IV SOLN: 80.0000 mg | INTRAVENOUS | Status: DC | PRN

## 2019-05-04 MED ORDER — LABETALOL HCL 5 MG/ML IV SOLN: 20.0000 mg | INTRAVENOUS | Status: DC | PRN

## 2019-05-04 MED ORDER — LIDOCAINE HCL (PF) 1 % IJ SOLN
INTRAMUSCULAR | Status: DC | PRN
  Administered 2019-05-04 (×2): 4 mL via EPIDURAL

## 2019-05-04 MED ORDER — FENTANYL CITRATE (PF) 100 MCG/2ML IJ SOLN
100.0000 ug | INTRAMUSCULAR | Status: DC | PRN
  Administered 2019-05-04: 100 ug via INTRAVENOUS
  Filled 2019-05-04: qty 2

## 2019-05-04 MED ORDER — EPHEDRINE 5 MG/ML INJ: 10.0000 mg | INTRAVENOUS | Status: DC | PRN

## 2019-05-04 MED ORDER — PHENYLEPHRINE 40 MCG/ML (10ML) SYRINGE FOR IV PUSH (FOR BLOOD PRESSURE SUPPORT): 80.0000 ug | PREFILLED_SYRINGE | INTRAVENOUS | Status: DC | PRN

## 2019-05-04 MED ORDER — FENTANYL-BUPIVACAINE-NACL 0.5-0.125-0.9 MG/250ML-% EP SOLN
12.0000 mL/h | EPIDURAL | Status: DC | PRN
  Filled 2019-05-04: qty 250

## 2019-05-04 MED ORDER — TERBUTALINE SULFATE 1 MG/ML IJ SOLN: 0.2500 mg | Freq: Once | INTRAMUSCULAR | Status: DC | PRN

## 2019-05-04 MED ORDER — LACTATED RINGERS IV SOLN
500.0000 mL | Freq: Once | INTRAVENOUS | Status: AC
  Administered 2019-05-04: 500 mL via INTRAVENOUS

## 2019-05-04 MED ORDER — DIPHENHYDRAMINE HCL 50 MG/ML IJ SOLN
12.5000 mg | INTRAMUSCULAR | Status: DC | PRN
  Administered 2019-05-04: 12.5 mg via INTRAVENOUS
  Filled 2019-05-04: qty 1

## 2019-05-04 MED ORDER — HYDRALAZINE HCL 20 MG/ML IJ SOLN: 10.0000 mg | INTRAMUSCULAR | Status: DC | PRN

## 2019-05-04 MED ORDER — SODIUM CHLORIDE (PF) 0.9 % IJ SOLN
INTRAMUSCULAR | Status: DC | PRN
  Administered 2019-05-04: 12 mL/h via EPIDURAL

## 2019-05-04 MED ORDER — LABETALOL HCL 5 MG/ML IV SOLN: 40.0000 mg | INTRAVENOUS | Status: DC | PRN

## 2019-05-04 NOTE — Anesthesia Procedure Notes (Signed)
Epidural Patient location during procedure: OB Start time: 05/04/2019 11:27 AM End time: 05/04/2019 11:29 AM  Staffing Anesthesiologist: Brennan Bailey, MD Performed: anesthesiologist   Preanesthetic Checklist Completed: patient identified, pre-op evaluation, timeout performed, IV checked, risks and benefits discussed and monitors and equipment checked  Epidural Patient position: sitting Prep: site prepped and draped and DuraPrep Patient monitoring: continuous pulse ox, blood pressure and heart rate Approach: midline Location: L2-L3 Injection technique: LOR air  Needle:  Needle type: Tuohy  Needle gauge: 17 G Needle length: 9 cm Needle insertion depth: 6 cm Catheter type: closed end flexible Catheter size: 19 Gauge Catheter at skin depth: 11 cm Test dose: negative and Other (1% lidocaine)  Assessment Events: blood not aspirated, injection not painful, no injection resistance, negative IV test and no paresthesia  Additional Notes Patient identified. Risks, benefits, and alternatives discussed with patient including but not limited to bleeding, infection, nerve damage, paralysis, failed block, incomplete pain control, headache, blood pressure changes, nausea, vomiting, reactions to medication, itching, and postpartum back pain. Confirmed with bedside nurse the patient's most recent platelet count. Confirmed with patient that they are not currently taking any anticoagulation, have any bleeding history, or any family history of bleeding disorders. Patient expressed understanding and wished to proceed. All questions were answered. Sterile technique was used throughout the entire procedure. Please see nursing notes for vital signs.   Mild lumbar dextroscoliosis noted, interspace best palpated at L2-3. Crisp LOR on first pass. Test dose was given through epidural catheter and negative prior to continuing to dose epidural or start infusion. Warning signs of high block given to the patient  including shortness of breath, tingling/numbness in hands, complete motor block, or any concerning symptoms with instructions to call for help. Patient was given instructions on fall risk and not to get out of bed. All questions and concerns addressed with instructions to call with any issues or inadequate analgesia.  Reason for block:procedure for pain

## 2019-05-04 NOTE — Progress Notes (Signed)
LABOR PROGRESS NOTE  Robyn Miller is a 23 y.o. G2P0010 at [redacted]w[redacted]d  admitted for cHTN and oligohydramnios  Subjective: Patient doing well. She is eating breakfast and took a shower this morning. Worried about pain with contractions.   Objective: BP (!) 148/84   Pulse 85   Temp 98.2 F (36.8 C) (Oral)   Resp 16   Ht 5\' 9"  (1.753 m)   Wt 100.2 kg   LMP 08/03/2018 (Within Days)   SpO2 98% Comment: room air   BMI 32.62 kg/m  or  Vitals:   05/04/19 0615 05/04/19 0723 05/04/19 0855 05/04/19 1000  BP: 138/76 (!) 145/96 (!) 144/88 (!) 148/84  Pulse: (!) 103 88 93 85  Resp: 16 18 16 16   Temp: 98 F (36.7 C) 98.2 F (36.8 C)    TempSrc: Oral Oral    SpO2:      Weight:      Height:        Dilation: 4 Effacement (%): 50 Station: -3 Presentation: Vertex Exam by:: Thea Alken, RN FHT: baseline rate 135bpm, moderate varibility, 15 x 15 acel, no decel Toco: 2-3 mins  Labs: Lab Results  Component Value Date   WBC 9.9 05/03/2019   HGB 11.8 (L) 05/03/2019   HCT 37.5 05/03/2019   MCV 86.6 05/03/2019   PLT 224 05/03/2019    Patient Active Problem List   Diagnosis Date Noted  . Oligohydramnios 05/04/2019  . Scoliosis 03/13/2019  . Anemia affecting pregnancy in third trimester 02/14/2019  . Supervision of high risk pregnancy, antepartum 10/23/2018  . Preexisting hypertension complicating pregnancy, antepartum 10/23/2018  . HSV infection 10/23/2018    Assessment / Plan: 23 y.o. G2P0010 at [redacted]w[redacted]d here for IOL due to cHTN and oligohydramnios. BPs moderately elevated. Patient on home Labetalol 200 mg BID.   Labor: Cytotec x3 given. FB out. Will plan to start Pitocin once has been 4 hours since last Cytotec and once epidural in place per patient preference.  Fetal Wellbeing:  Cat I Pain Control:  Plans for epidural now  Anticipated MOD: Vaginal  Phill Myron, D.O. [facility] Family Medicine Fellow, [employer] for [facility] 05/04/2019,  10:06 AM

## 2019-05-04 NOTE — Progress Notes (Signed)
LABOR PROGRESS NOTE  Robyn Miller is a 23 y.o. G2P0010 at [redacted]w[redacted]d  admitted for cHTN and oligohydramnios  Subjective: Strip note.   Objective: BP (!) 142/84   Pulse 91   Temp 98.1 F (36.7 C) (Oral)   Resp 18   Ht 5\' 9"  (1.753 m)   Wt 100.2 kg   LMP 08/03/2018 (Within Days)   SpO2 98% Comment: room air   BMI 32.62 kg/m  or  Vitals:   05/04/19 1234 05/04/19 1300 05/04/19 1330 05/04/19 1400  BP: 132/65 138/76 139/75 (!) 142/84  Pulse: 91 87 89 91  Resp: 16 16 16 18   Temp: 98.1 F (36.7 C)     TempSrc: Oral     SpO2:      Weight:      Height:        Dilation: 4 Effacement (%): 60 Station: -3 Presentation: Vertex Exam by:: SRussell, RN FHT: baseline rate 130bpm, moderate varibility, 15 x 15 acel, no decel Toco: 2-5 mins  Labs: Lab Results  Component Value Date   WBC 13.7 (H) 05/04/2019   HGB 12.3 05/04/2019   HCT 38.8 05/04/2019   MCV 87.0 05/04/2019   PLT 219 05/04/2019    Patient Active Problem List   Diagnosis Date Noted  . Oligohydramnios 05/04/2019  . Scoliosis 03/13/2019  . Anemia affecting pregnancy in third trimester 02/14/2019  . Supervision of high risk pregnancy, antepartum 10/23/2018  . Preexisting hypertension complicating pregnancy, antepartum 10/23/2018  . HSV infection 10/23/2018    Assessment / Plan: 23 y.o. G2P0010 at [redacted]w[redacted]d here for IOL due to cHTN and oligohydramnios. BPs moderately elevated. Patient on home Labetalol 200 mg BID.   Labor: S/p cyto and FB. Now on Pitocin at 4 mu/min. Titrate as appropriate.  Fetal Wellbeing:  Cat I Pain Control:  Epidural in place  Anticipated MOD: Vaginal  Phill Myron, D.O. [facility] Family Medicine Fellow, [facility] for [facility], [place] [facility] 05/04/2019, 2:50 PM

## 2019-05-04 NOTE — Progress Notes (Signed)
LABOR PROGRESS NOTE  JAYCIE KREGEL is a 23 y.o. G2P0010 at [redacted]w[redacted]d  admitted for cHTN and oligohydramnios  Subjective: Patient doing well. Comfortable with epidural. Asks about AROM.    Objective: BP 138/74   Pulse 88   Temp 98 F (36.7 C) (Oral)   Resp 18   Ht 5\' 9"  (1.753 m)   Wt 100.2 kg   LMP 08/03/2018 (Within Days)   SpO2 98% Comment: room air   BMI 32.62 kg/m  or  Vitals:   05/04/19 1830 05/04/19 1900 05/04/19 1927 05/04/19 1931  BP: (!) 150/91 (!) 154/86  138/74  Pulse: (!) 105 89  88  Resp: 18 16  18   Temp:   98 F (36.7 C)   TempSrc:   Oral   SpO2:      Weight:      Height:        Dilation: 6.5 Effacement (%): 80 Station: -2 Presentation: Vertex Exam by:: SRussell, RN  FHT: baseline rate 150bpm, moderate varibility, 15 x 15 acel, no decel Toco: q1-5 min   Labs: Lab Results  Component Value Date   WBC 13.7 (H) 05/04/2019   HGB 12.3 05/04/2019   HCT 38.8 05/04/2019   MCV 87.0 05/04/2019   PLT 219 05/04/2019    Patient Active Problem List   Diagnosis Date Noted  . Oligohydramnios 05/04/2019  . Scoliosis 03/13/2019  . Anemia affecting pregnancy in third trimester 02/14/2019  . Supervision of high risk pregnancy, antepartum 10/23/2018  . Preexisting hypertension complicating pregnancy, antepartum 10/23/2018  . HSV infection 10/23/2018    Assessment / Plan: 23 y.o. G2P0010 at [redacted]w[redacted]d here for IOL due to cHTN and oligohydramnios. BPs moderately elevated. Patient on home Labetalol 200 mg BID.   Labor: S/p cyto and FB. Now on Pitocin at 6 mu/min. Titrate as appropriate. Patient has made cervical change. Will plan for night team to evaluate for AROM.  Fetal Wellbeing:  Cat I Pain Control:  Epidural in place  Anticipated MOD: Vaginal  Phill Myron, D.O. [facility] Family Medicine Fellow, [facility] for [facility], [place] [facility] 05/04/2019, 7:39 PM

## 2019-05-04 NOTE — Progress Notes (Signed)
LABOR PROGRESS NOTE  Robyn Miller is a 23 y.o. G2P0010 at [redacted]w[redacted]d  admitted for IOL for CHTN and Oligo   Subjective: Patient doing well, resting, reports occasional cramping   Objective: BP (!) 155/102   Pulse 92   Temp 97.9 F (36.6 C) (Oral)   Resp 16   Ht 5\' 9"  (1.753 m)   Wt 100.2 kg   LMP 08/03/2018 (Within Days)   SpO2 98% Comment: room air   BMI 32.62 kg/m  or  Vitals:   05/03/19 1944 05/03/19 1948 05/03/19 2235 05/04/19 0015  BP:  (!) 143/88 (!) 142/79 (!) 155/102  Pulse:  97 93 92  Resp:  18 16 16   Temp:  98.4 F (36.9 C)  97.9 F (36.6 C)  TempSrc:  Oral  Oral  SpO2:      Weight: 100.2 kg     Height: 5\' 9"  (1.753 m)       FB placed @ 0022, 1 deceleration occurred after placement of FB  Dilation: 1 Effacement (%): Thick Station: -3 Presentation: Vertex Exam by:: Stann Mainland CNM FHT: baseline rate 140, moderate varibility, +accel, 1 late decel after FB placement Toco: occasional mild contractions   Labs: Lab Results  Component Value Date   WBC 9.9 05/03/2019   HGB 11.8 (L) 05/03/2019   HCT 37.5 05/03/2019   MCV 86.6 05/03/2019   PLT 224 05/03/2019    Patient Active Problem List   Diagnosis Date Noted  . Oligohydramnios 05/04/2019  . Scoliosis 03/13/2019  . Anemia affecting pregnancy in third trimester 02/14/2019  . Supervision of high risk pregnancy, antepartum 10/23/2018  . Preexisting hypertension complicating pregnancy, antepartum 10/23/2018  . HSV infection 10/23/2018    Assessment / Plan: 23 y.o. G2P0010 at [redacted]w[redacted]d here for IOL for CHTN and Oligo   Labor: FB placed, hold cytotec for 30 minutes after deceleration d/t FB placement  Fetal Wellbeing:  Cat II  Pain Control:  Pain medication ordered PRN  Anticipated MOD:  SVD  Lajean Manes, CNM, 05/04/2019, 12:44 AM

## 2019-05-04 NOTE — Progress Notes (Signed)
LABOR PROGRESS NOTE  Robyn Miller is a 23 y.o. G2P0010 at [redacted]w[redacted]d  admitted for cHTN and oligohydramnios  Subjective: She is doing well. She is up ambulating to the restroom.   Objective: BP 124/68   Pulse (!) 114   Temp 97.6 F (36.4 C) (Oral)   Resp 16   Ht 5\' 9"  (1.753 m)   Wt 100.2 kg   LMP 08/03/2018 (Within Days)   SpO2 98% Comment: room air   BMI 32.62 kg/m  or  Vitals:   05/04/19 0331 05/04/19 0400 05/04/19 0428 05/04/19 0533  BP: 128/70  (!) 145/81 124/68  Pulse: (!) 101  (!) 108 (!) 114  Resp: 16 18  16   Temp: 98.5 F (36.9 C)   97.6 F (36.4 C)  TempSrc: Oral   Oral  SpO2:      Weight:      Height:        Dilation: 4 Effacement (%): 50 Station: -3 Presentation: Vertex Exam by:: Thea Alken, RN FHT: baseline rate 150bpm, moderate varibility, 15 x 15 acel, occasional variable decel Toco: 2-4 mins  Labs: Lab Results  Component Value Date   WBC 9.9 05/03/2019   HGB 11.8 (L) 05/03/2019   HCT 37.5 05/03/2019   MCV 86.6 05/03/2019   PLT 224 05/03/2019    Patient Active Problem List   Diagnosis Date Noted  . Oligohydramnios 05/04/2019  . Scoliosis 03/13/2019  . Anemia affecting pregnancy in third trimester 02/14/2019  . Supervision of high risk pregnancy, antepartum 10/23/2018  . Preexisting hypertension complicating pregnancy, antepartum 10/23/2018  . HSV infection 10/23/2018    Assessment / Plan: 23 y.o. G2P0010 at [redacted]w[redacted]d here for IOL due to cHTN and oligohydramnios.  Labor: Cytotec x3 given. FB out. Consider pitocin at next cervical exam. Fetal Wellbeing:  Cat I Pain Control:  Maternally supported Anticipated MOD: Vaginal  Zenith Lamphier Autry-Lott, D.O. Family Medicine Resident, PGY-1 05/04/2019, 6:15 AM

## 2019-05-04 NOTE — Anesthesia Preprocedure Evaluation (Signed)
Anesthesia Evaluation  Patient identified by MRN, date of birth, ID band Patient awake    Reviewed: Allergy & Precautions, Patient's Chart, lab work & pertinent test results, reviewed documented beta blocker date and time   History of Anesthesia Complications Negative for: history of anesthetic complications  Airway Mallampati: II  TM Distance: >3 FB Neck ROM: Full    Dental no notable dental hx.    Pulmonary neg pulmonary ROS,    Pulmonary exam normal        Cardiovascular hypertension (gestational), Pt. on home beta blockers Normal cardiovascular exam     Neuro/Psych Scoliosis     GI/Hepatic negative GI ROS, Neg liver ROS,   Endo/Other  negative endocrine ROS  Renal/GU negative Renal ROS     Musculoskeletal negative musculoskeletal ROS (+)   Abdominal   Peds  Hematology negative hematology ROS (+)   Anesthesia Other Findings Day of surgery medications reviewed with the patient.  Reproductive/Obstetrics                            Anesthesia Physical Anesthesia Plan  ASA: II  Anesthesia Plan: Epidural   Post-op Pain Management:    Induction:   PONV Risk Score and Plan: Treatment may vary due to age or medical condition  Airway Management Planned: Natural Airway  Additional Equipment:   Intra-op Plan:   Post-operative Plan:   Informed Consent: I have reviewed the patients History and Physical, chart, labs and discussed the procedure including the risks, benefits and alternatives for the proposed anesthesia with the patient or authorized representative who has indicated his/her understanding and acceptance.       Plan Discussed with: CRNA  Anesthesia Plan Comments:         Anesthesia Quick Evaluation

## 2019-05-04 NOTE — Progress Notes (Addendum)
LABOR PROGRESS NOTE  Robyn Miller is a 23 y.o. G2P0010 at [redacted]w[redacted]d  admitted for IOL due to [facility] and oligohydramnios.  Subjective: She is doing well resting in bed on her left side. She is not currently experiencing discomfort.  Objective: BP 138/66   Pulse 76   Temp 98 F (36.7 C) (Oral)   Resp 17   Ht 5\' 9"  (1.753 m)   Wt 100.2 kg   LMP 08/03/2018 (Within Days)   SpO2 98% Comment: room air   BMI 32.62 kg/m  or  Vitals:   05/04/19 1931 05/04/19 2001 05/04/19 2031 05/04/19 2101  BP: 138/74 (!) 146/74 138/66 (!) 152/92  Pulse: 88 75 76 86  Resp: 18 17 17 18   Temp:      TempSrc:      SpO2:      Weight:      Height:        Dilation: 7 Effacement (%): 80 Station: -2 Presentation: Vertex Exam by:: Miller FHT: baseline rate 155bpm, moderate varibility, 10 x 10 acel, no decel Toco: 2-3 mins  Labs: Lab Results  Component Value Date   WBC 13.7 (H) 05/04/2019   HGB 12.3 05/04/2019   HCT 38.8 05/04/2019   MCV 87.0 05/04/2019   PLT 219 05/04/2019    Patient Active Problem List   Diagnosis Date Noted  . Oligohydramnios 05/04/2019  . Scoliosis 03/13/2019  . Anemia affecting pregnancy in third trimester 02/14/2019  . Supervision of high risk pregnancy, antepartum 10/23/2018  . Preexisting hypertension complicating pregnancy, antepartum 10/23/2018  . HSV infection 10/23/2018    Assessment / Plan: 23 y.o. G2P0010 at [redacted]w[redacted]d here for IOL due to cHTN and oligo  Labor: AROM (2043). Pitocin 74mU/min. Continue time appropriate cervical exam. CHTN: Labetalol 200mg  PO BID (due 2200) HSV: Valcyclovir 500mg  PO BID (due 2200) Fetal Wellbeing:  Cat I, Vertex Pain Control:  Epidural Anticipated MOD:  Vaginal  Robyn Miller, D.O. Family Medicine Resident, PGY-1  05/04/2019, 8:53 PM

## 2019-05-05 ENCOUNTER — Encounter (HOSPITAL_COMMUNITY): Payer: Self-pay

## 2019-05-05 LAB — CBC
HCT: 37.8 % (ref 36.0–46.0)
Hemoglobin: 12 g/dL (ref 12.0–15.0)
MCH: 27.6 pg (ref 26.0–34.0)
MCHC: 31.7 g/dL (ref 30.0–36.0)
MCV: 86.9 fL (ref 80.0–100.0)
Platelets: 223 10*3/uL (ref 150–400)
RBC: 4.35 MIL/uL (ref 3.87–5.11)
RDW: 18.5 % — ABNORMAL HIGH (ref 11.5–15.5)
WBC: 22.5 10*3/uL — ABNORMAL HIGH (ref 4.0–10.5)
nRBC: 0 % (ref 0.0–0.2)

## 2019-05-05 MED ORDER — WITCH HAZEL-GLYCERIN EX PADS: 1.0000 "application " | MEDICATED_PAD | CUTANEOUS | Status: DC | PRN

## 2019-05-05 MED ORDER — COCONUT OIL OIL: 1.0000 "application " | TOPICAL_OIL | Status: DC | PRN

## 2019-05-05 MED ORDER — DIPHENHYDRAMINE HCL 25 MG PO CAPS: 25.0000 mg | ORAL_CAPSULE | Freq: Four times a day (QID) | ORAL | Status: DC | PRN

## 2019-05-05 MED ORDER — ACETAMINOPHEN 325 MG PO TABS: 650.0000 mg | ORAL_TABLET | ORAL | Status: DC | PRN

## 2019-05-05 MED ORDER — DIBUCAINE (PERIANAL) 1 % EX OINT: 1.0000 "application " | TOPICAL_OINTMENT | CUTANEOUS | Status: DC | PRN

## 2019-05-05 MED ORDER — SIMETHICONE 80 MG PO CHEW: 80.0000 mg | CHEWABLE_TABLET | ORAL | Status: DC | PRN

## 2019-05-05 MED ORDER — ENALAPRIL MALEATE 5 MG PO TABS
10.0000 mg | ORAL_TABLET | Freq: Every day | ORAL | Status: DC
  Administered 2019-05-05 – 2019-05-06 (×2): 10 mg via ORAL
  Filled 2019-05-05 (×2): qty 2

## 2019-05-05 MED ORDER — BENZOCAINE-MENTHOL 20-0.5 % EX AERO
1.0000 "application " | INHALATION_SPRAY | CUTANEOUS | Status: DC | PRN
  Filled 2019-05-05: qty 56

## 2019-05-05 MED ORDER — ONDANSETRON HCL 4 MG/2ML IJ SOLN: 4.0000 mg | INTRAMUSCULAR | Status: DC | PRN

## 2019-05-05 MED ORDER — PRENATAL MULTIVITAMIN CH
1.0000 | ORAL_TABLET | Freq: Every day | ORAL | Status: DC
  Administered 2019-05-05 – 2019-05-06 (×2): 1 via ORAL
  Filled 2019-05-05 (×2): qty 1

## 2019-05-05 MED ORDER — IBUPROFEN 600 MG PO TABS
600.0000 mg | ORAL_TABLET | Freq: Four times a day (QID) | ORAL | Status: DC
  Administered 2019-05-05: 600 mg via ORAL
  Filled 2019-05-05: qty 1

## 2019-05-05 MED ORDER — IBUPROFEN 600 MG PO TABS
600.0000 mg | ORAL_TABLET | Freq: Four times a day (QID) | ORAL | Status: DC
  Administered 2019-05-05 – 2019-05-06 (×5): 600 mg via ORAL
  Filled 2019-05-05 (×5): qty 1

## 2019-05-05 MED ORDER — FUROSEMIDE 10 MG/ML IJ SOLN
20.0000 mg | Freq: Once | INTRAMUSCULAR | Status: AC
  Administered 2019-05-05: 20 mg via INTRAVENOUS
  Filled 2019-05-05: qty 4

## 2019-05-05 MED ORDER — ZOLPIDEM TARTRATE 5 MG PO TABS: 5.0000 mg | ORAL_TABLET | Freq: Every evening | ORAL | Status: DC | PRN

## 2019-05-05 MED ORDER — ENALAPRIL MALEATE 5 MG PO TABS: 5.0000 mg | ORAL_TABLET | Freq: Every day | ORAL | Status: DC

## 2019-05-05 MED ORDER — SENNOSIDES-DOCUSATE SODIUM 8.6-50 MG PO TABS
2.0000 | ORAL_TABLET | ORAL | Status: DC
  Administered 2019-05-05 – 2019-05-06 (×2): 2 via ORAL
  Filled 2019-05-05 (×2): qty 2

## 2019-05-05 MED ORDER — ONDANSETRON HCL 4 MG PO TABS: 4.0000 mg | ORAL_TABLET | ORAL | Status: DC | PRN

## 2019-05-05 MED ORDER — TETANUS-DIPHTH-ACELL PERTUSSIS 5-2.5-18.5 LF-MCG/0.5 IM SUSP: 0.5000 mL | Freq: Once | INTRAMUSCULAR | Status: DC

## 2019-05-05 NOTE — Lactation Note (Signed)
This note was copied from a baby's chart. Lactation Consultation Note  Patient Name: Robyn Miller [id number] Date: 05/05/2019 Reason for consult: Initial assessment  I stopped to check on Mom to see if she was ready for me to do a flange check while pumping. Mom's support person [facility]) has stepped out of the room, Mom will call me when [facility] has returned (so MGM can hold baby).   Mom knows how to call out for me. Matthias Hughs [facility] 05/05/2019, 12:39 PM

## 2019-05-05 NOTE — Lactation Note (Addendum)
This note was copied from a baby's chart. Lactation Consultation Note  Patient Name: Robyn Miller Today's Date: 05/05/2019 Reason for consult: Follow-up assessment  Mom was observed pumping; size 24 flanges are appropriate at this time. No EBM was yielded, but a few additional drops were collected via hand expression. The colostrum was placed in the colostrum vial. Mom knows to pump whenever infant receives a bottle.   Mom has her own Medela pump at home. Mom was shown how to assemble & use hand pump (single- & double-mode) that was included in pump kit.   Mom might call out later this evening for assistance at the breast.   Mom is on enalapril 10mg (L2).  Richey, Kimberely Hamilton 05/05/2019, 2:19 PM    

## 2019-05-05 NOTE — Discharge Summary (Signed)
Postpartum Discharge Summary     Patient Name: Robyn Miller DOB: 24-Apr-1996 MRN: [id number]  Date of admission: 05/03/2019 Delivering Provider: Fatima Blank A   Date of discharge: 05/06/2019  Admitting diagnosis: INDUCTION Intrauterine pregnancy: [redacted]w[redacted]d     Secondary diagnosis:  Active Problems:   Preexisting hypertension complicating pregnancy, antepartum   HSV infection   Oligohydramnios   Vaginal delivery  Additional problems: none     Discharge diagnosis: Term Pregnancy Delivered                                                                                                Post partum procedures:none  Augmentation: AROM, Pitocin, Cytotec and Foley Balloon  Complications: None  Hospital course:  Induction of Labor With Vaginal Delivery   23 y.o. yo G2P1011 at [redacted]w[redacted]d was admitted to the hospital 05/03/2019 for induction of labor.  Indication for induction: CHTN and oligohydramnios.  Patient had an uncomplicated labor course as follows: Membrane Rupture Time/Date: 8:43 PM ,05/04/2019   Intrapartum Procedures: Episiotomy: None [1]                                         Lacerations:  1st degree [2];Labial [10]  Patient had delivery of a Viable infant.  Information for the patient's newborn:  Josslin, Sanjuan [[id number]]  Delivery Method: Vag-Spont(filed from delivery)    05/04/2019  Details of delivery can be found in separate delivery note.  Patient had a routine postpartum course. Patient is discharged home 05/06/19.  Magnesium Sulfate recieved: No BMZ received: No  Physical exam  Vitals:   05/05/19 2028 05/05/19 2139 05/06/19 0500 05/06/19 0907  BP: (!) 151/98 (!) 143/97 (!) 133/92 (!) 148/88  Pulse:  68 61 78  Resp: 16 17 16    Temp:  98 F (36.7 C) 98 F (36.7 C)   TempSrc:  Oral Oral   SpO2:  98%    Weight:      Height:       General: alert, cooperative and no distress Lochia: appropriate Uterine Fundus: firm Incision: N/A DVT Evaluation: No  evidence of DVT seen on physical exam. Negative Homan's sign. No cords or calf tenderness. No significant calf/ankle edema. Labs: Lab Results  Component Value Date   WBC 22.5 (H) 05/05/2019   HGB 12.0 05/05/2019   HCT 37.8 05/05/2019   MCV 86.9 05/05/2019   PLT 223 05/05/2019   CMP Latest Ref Rng & Units 05/03/2019  Glucose 70 - 99 mg/dL 106(H)  BUN 6 - 20 mg/dL <5(L)  Creatinine 0.44 - 1.00 mg/dL 0.64  Sodium 135 - 145 mmol/L 138  Potassium 3.5 - 5.1 mmol/L 3.7  Chloride 98 - 111 mmol/L 108  CO2 22 - 32 mmol/L 20(L)  Calcium 8.9 - 10.3 mg/dL 9.0  Total Protein 6.5 - 8.1 g/dL 6.7  Total Bilirubin 0.3 - 1.2 mg/dL 0.3  Alkaline Phos 38 - 126 U/L 127(H)  AST 15 - 41 U/L 23  ALT 0 - 44 U/L 12  Discharge instruction: per After Visit Summary and "Baby and Me Booklet".  After visit meds:  Allergies as of 05/06/2019   No Known Allergies   Rx'd Enalapril 10 mg daily    Diet: routine diet  Activity: Advance as tolerated. Pelvic rest for 6 weeks.   Outpatient follow up:1 week BP check then 6 weeks PP visit Follow up Appt: Future Appointments  Date Time Provider Department Center  05/07/2019  8:20 AM [facility] 1 [facility] None   Follow up Visit:   [facility]  Please schedule this patient for PP visit in: 4 weeks, in office for BP check  High risk pregnancy complicated by: HTN  Delivery mode: SVD  Anticipated Birth Control: POPs  PP Procedures needed: BP check  Schedule Integrated BH visit: no  Provider: MD    Newborn Data: Live born female  Birth Weight: 6 lb 4.7 oz (2855 g) APGAR: 8, 9  Newborn Delivery   Time head delivered: 05/04/2019 23:17:22 Birth date/time: 05/04/2019 23:17:00 Delivery type: Vaginal, Spontaneous      Baby Feeding: Breast & bottle Disposition:home with mother   05/06/2019 Raelyn Moraolitta Merric Yost, CNM

## 2019-05-05 NOTE — Lactation Note (Signed)
This note was copied from a baby's chart. Lactation Consultation Note  Patient Name: Robyn Miller OEUMP'N Date: 05/05/2019 Reason for consult: Initial assessment  Initial visit at 11 hours of life. Mom is a P1 who reports + breast changes w/pregnancy. Hand expression was taught to Mom w/small drops resulting. The small drops were put in a colostrum vial & placed in the refrigerator (infant is currently sleeping in the bassinet). Mom has had nipple piercings for the last 6 years, which were removed yesterday.   This LC to return after lunch.   Matthias Hughs [facility] 05/05/2019, 10:50 AM

## 2019-05-05 NOTE — Progress Notes (Signed)
Post Partum Day 1 Blood pressure (!) 158/97, pulse 76, temperature 98.2 F (36.8 C), temperature source Oral, resp. rate 18, height 5\' 9"  (1.753 m), weight 100.2 kg, last menstrual period 08/03/2018, SpO2 98 %, unknown if currently breastfeeding.   Patient Vitals for the past 24 hrs:  BP Temp Temp src Pulse Resp SpO2  05/05/19 1547 (!) 158/97 98.2 F (36.8 C) Oral 76 18 98 %  05/05/19 1148 (!) 150/83 98 F (36.7 C) Oral 84 19 -  05/05/19 0724 (!) 143/94 97.7 F (36.5 C) Oral 79 18 -    Recent Labs    05/04/19 1025 05/05/19 0430  HGB 12.3 12.0  HCT 38.8 37.8    Assessment/Plan: Patient started on Vasotec 10 this morning (previously on Vasotec 5). Continues to experience elevated BPs. Per consult with Dr. Harolyn Rutherford, offer Lasix 20 mg IV and continue to monitor BPs.   LOS: 2 days   Darlina Rumpf, CNM 05/05/2019, 7:52 PM

## 2019-05-05 NOTE — Progress Notes (Signed)
LABOR PROGRESS NOTE  Robyn Miller is a 23 y.o. G2P0010 at [redacted]w[redacted]d  admitted for IOL due to [facility] and oligohydramnios.  Subjective: She is in bed lying her right side and is have moderate discomfort at this time.  Objective: BP (!) 150/91   Pulse 99   Temp 98 F (36.7 C) (Oral)   Resp 17   Ht 5\' 9"  (1.753 m)   Wt 100.2 kg   LMP 08/03/2018 (Within Days)   SpO2 97%   BMI 32.62 kg/m  or  Vitals:  BP: 138/66 HR: 86 RR:17  Dilation: 8 Effacement (%): 90 Cervical Position: Middle Station: 0 Presentation: Vertex Exam by:: Dr. Janus Molder   FHT: baseline rate 155, moderate varibility, 10 x 10 acel, prolonged decel Toco: 1-2 mins  Labs: Lab Results  Component Value Date   WBC 13.7 (H) 05/04/2019   HGB 12.3 05/04/2019   HCT 38.8 05/04/2019   MCV 87.0 05/04/2019   PLT 219 05/04/2019    Patient Active Problem List   Diagnosis Date Noted  . Oligohydramnios 05/04/2019  . Scoliosis 03/13/2019  . Anemia affecting pregnancy in third trimester 02/14/2019  . Supervision of high risk pregnancy, antepartum 10/23/2018  . Preexisting hypertension complicating pregnancy, antepartum 10/23/2018  . HSV infection 10/23/2018    Assessment / Plan: 23 y.o. G2P0010 at [redacted]w[redacted]d here for IOL due to cHTN and oligohydramnios.  Labor: SFE was attempted but unsuccessful due to monitor error. Return to external fetal heart monitoring. Pitocin temporarily stopped. Continue time appropriate cervical exams. Fetal Wellbeing:  Cat II returned to Cat I after pitocin break and position changes. Pain Control:  Epidural Anticipated MOD:  Vaginal  Robyn Miller, D.O. Family Medicine Resident, PGY-1 05/05/2019, 12:28 AM  *Filed at a later time due to patient care.

## 2019-05-05 NOTE — Progress Notes (Addendum)
POSTPARTUM PROGRESS NOTE  Post Partum Day 1  Subjective:  KARLISHA MATHENA is a 23 y.o. G2P1011 s/p SVD at [redacted]w[redacted]d.  She reports she is doing well. No acute events overnight. She denies any problems with ambulating, voiding or po intake. Denies nausea or vomiting.  Pain is well controlled.  Lochia is appropriate.  Objective: Blood pressure (!) 143/94, pulse 79, temperature 97.7 F (36.5 C), temperature source Oral, resp. rate 18, height 5\' 9"  (1.753 m), weight 100.2 kg, last menstrual period 08/03/2018, SpO2 97 %, unknown if currently breastfeeding.  Physical Exam:  General: alert, cooperative and no distress Chest: no respiratory distress Heart: distal pulses intact Abdomen: soft, nontender,  Uterine Fundus: firm, appropriately tender DVT Evaluation: No calf swelling or tenderness Extremities: No edema Skin: warm, dry  Recent Labs    05/04/19 1025 05/05/19 0430  HGB 12.3 12.0  HCT 38.8 37.8    Assessment/Plan: ROGELIO WINBUSH is a 23 y.o. G2P1011 s/p SVD at [redacted]w[redacted]d   PPD#1 - Doing well  Routine postpartum care Contraception: OCPs Feeding: Breast Dispo: Plan for discharge home 05/06/2019.   LOS: 2 days   Gerlene Fee, D.O. Family Medicine Resident, PGY-1 05/05/2019, 6:51 AM

## 2019-05-05 NOTE — Anesthesia Postprocedure Evaluation (Signed)
Anesthesia Post Note  Patient: Robyn Miller  Procedure(s) Performed: AN AD HOC LABOR EPIDURAL     Patient location during evaluation: Mother Baby Anesthesia Type: Epidural Level of consciousness: awake and alert Pain management: pain level controlled Vital Signs Assessment: post-procedure vital signs reviewed and stable Respiratory status: spontaneous breathing, nonlabored ventilation and respiratory function stable Cardiovascular status: stable Postop Assessment: no headache, no backache, epidural receding, no apparent nausea or vomiting, patient able to bend at knees, adequate PO intake and able to ambulate Anesthetic complications: no    Last Vitals:  Vitals:   05/05/19 0313 05/05/19 0724  BP: (!) 156/89 (!) 143/94  Pulse: 92 79  Resp: 18 18  Temp: 37.2 C 36.5 C  SpO2:      Last Pain:  Vitals:   05/05/19 0729  TempSrc:   PainSc: 0-No pain   Pain Goal:                   [employer]

## 2019-05-06 MED ORDER — PRENATAL 27-0.8 MG PO TABS: 1.0000 | ORAL_TABLET | Freq: Every day | ORAL | 0 refills | Status: DC

## 2019-05-06 MED ORDER — ENALAPRIL MALEATE 10 MG PO TABS: 10.0000 mg | ORAL_TABLET | Freq: Every day | ORAL | 3 refills | Status: DC

## 2019-05-06 MED ORDER — IBUPROFEN 600 MG PO TABS: 600.0000 mg | ORAL_TABLET | Freq: Four times a day (QID) | ORAL | 0 refills | Status: AC

## 2019-05-06 MED FILL — PRENATAL FE 27-1 MG TAB: 27-1 | 30 days supply | Qty: 30 | Fill #0

## 2019-05-06 MED FILL — ENALAPRIL MALEATE 10 MG TAB: 10 | 30 days supply | Qty: 30 | Fill #0

## 2019-05-06 MED FILL — IBUPROFEN 600 MG TABLET: 600 | 14 days supply | Qty: 56 | Fill #0

## 2019-05-06 NOTE — Progress Notes (Signed)
Post Partum Day 2 Subjective: Robyn Miller  is a 23 y.o. G2P1011 s/p SVD at [redacted]w[redacted]d.  She reports she is doing well. No acute events overnight. She denies any problems with ambulating, voiding or po intake. Denies nausea or vomiting. Pain is well controlled on ibuprofen.  Lochia is moderate and improving.   Objective: Blood pressure (!) 148/88, pulse 78, temperature 98 F (36.7 C), temperature source Oral, resp. rate 16, height 5\' 9"  (1.753 m), weight 100.2 kg, last menstrual period 08/03/2018, SpO2 98 %, currently breast and bottle feeding.  Physical Exam:  General: alert, cooperative and no distress Lochia: appropriate Uterine Fundus: firm Incision: N/A DVT Evaluation: No evidence of DVT seen on physical exam. Negative Homan's sign. No cords or calf tenderness. No significant calf/ankle edema.  Recent Labs    05/04/19 1025 05/05/19 0430  HGB 12.3 12.0  HCT 38.8 37.8    Assessment/Plan: Discharge home, Breastfeeding and Contraception PoPs   LOS: 3 days   Laury Deep, MSN, CNM 05/06/2019, 9:23 AM

## 2019-05-06 NOTE — Lactation Note (Signed)
This note was copied from a baby's chart. Lactation Consultation Note  Patient Name: Robyn Miller [id number]'D Date: 05/06/2019 Reason for consult: Follow-up assessment;Early term 37-38.6wks;Primapara;1st time breastfeeding  P1 mother whose infant is now 8 hours old.  Baby was asleep in bassinet when I arrived.  Mother has been bottle feeding and has only latched baby to the breast one time since delivery.  Baby fed 20 mls of formula at 0450.  Offered to assist with latching and mother agreeable.  Undressed baby and discussed STS with mother.  Reviewed breast feeding basics. Mother demonstrated hand expression and was able to express one drop of colostrum only.  Stressed the importance of doing hand expression before/after feeding or pumping to help increase milk supply.  Mother has been using the DEBP but not seeing drops yet.  Encouraged her to be diligent today about latching baby to the breast first, followed by hand expression and pumping for 15 minutes.    Mother's breasts are soft and non tender and nipples are everted and intact.  Assisted baby to latch in the cross cradle position on the left breast easily.  Once at the breast baby only sucked a few times before stopping.  She was quiet and awake but not interested in sucking.  Gentle stimulation shown to mother but baby still not interested in feeding.  She quietly gazed up at her mother.  Placed her STS with mother and she was calm and happy.  Mother may be interested in pumping and bottle feeding only, however, I suggested she continue to try latching baby.  Mother will call if she needs further assistance.  She is expecting to be discharged today and has a DEBP for home use.  A manual pump was provided by the [facility] yesterday.  Engorgement prevention/treatment reviewed.  Milk storage times discussed and finger feeding demonstrated.  Mother has a couple colostrum drops in the refrigerator at this time.  She has our OP phone number for  questions/concerns after discharge.   Maternal Data Formula Feeding for Exclusion: Yes Reason for exclusion: Mother's choice to formula and breast feed on admission Does the patient have breastfeeding experience prior to this delivery?: Yes  Feeding Feeding Type: Breast Fed Nipple Type: Slow - flow  LATCH Score Latch: Too sleepy or reluctant, no latch achieved, no sucking elicited.  Audible Swallowing: None  Type of Nipple: Everted at rest and after stimulation  Comfort (Breast/Nipple): Soft / non-tender  Hold (Positioning): Assistance needed to correctly position infant at breast and maintain latch.  LATCH Score: 5  Interventions Interventions: Breast feeding basics reviewed;Assisted with latch;Skin to skin;Breast massage;Breast compression;DEBP;Position options;Support pillows  Lactation Tools Discussed/Used     Consult Status Consult Status: Complete Date: 05/06/19 Follow-up type: Call as needed    [name] [name] 05/06/2019, 8:36 AM

## 2019-05-06 NOTE — Progress Notes (Signed)
Reviewed 11AM BP 133/88 with CNM Renato Battles. CNM states Robyn Miller may be discharged to home with BP follow up on Friday May 10, 2019.

## 2019-05-07 ENCOUNTER — Other Ambulatory Visit (HOSPITAL_COMMUNITY): Payer: Medicaid Other

## 2019-05-09 ENCOUNTER — Inpatient Hospital Stay (HOSPITAL_COMMUNITY)
Admission: AD | Admit: 2019-05-09 | Payer: Medicaid Other | Source: Home / Self Care | Admitting: Obstetrics & Gynecology

## 2019-05-09 ENCOUNTER — Inpatient Hospital Stay (HOSPITAL_COMMUNITY): Payer: Medicaid Other

## 2019-05-10 ENCOUNTER — Ambulatory Visit (INDEPENDENT_AMBULATORY_CARE_PROVIDER_SITE_OTHER): Payer: Medicaid Other

## 2019-05-10 ENCOUNTER — Other Ambulatory Visit: Payer: Self-pay

## 2019-05-10 VITALS — BP 136/82 | Wt 202.5 lb

## 2019-05-10 DIAGNOSIS — Z0130 Encounter for examination of blood pressure without abnormal findings: Secondary | ICD-10-CM

## 2019-05-10 NOTE — Progress Notes (Signed)
Pt here today for BP check. Pt denies any sx's of HTN.  Pt reports some mild bleeding with no pain.  Pt reports taking last dose of Vasotec 10 mg tablet this am.  BP RA 138/90.  Notified Kerry Hough, PA, provider recommended a rpt BP.  Rpt BP 136/82 LA.  Notified Kerry Hough, PA provider recommendation to continue taking Vasotec 10 mg po tablet, continue to monitor for sx's of HTN, and to f/u at pp visit.  Notified pt provider's recommendation.  Pt verbalized understanding.    Frances Nickels 05/10/19

## 2019-05-10 NOTE — Progress Notes (Signed)
Patient seen and assessed by nursing staff during this encounter. I have reviewed the chart and agree with the documentation and plan.  Kerry Hough, PA-C 05/10/2019 12:02 PM

## 2019-05-18 ENCOUNTER — Encounter: Payer: Self-pay | Admitting: Obstetrics and Gynecology

## 2019-06-05 ENCOUNTER — Other Ambulatory Visit: Payer: Self-pay

## 2019-06-05 ENCOUNTER — Telehealth: Payer: Self-pay | Admitting: Obstetrics & Gynecology

## 2019-06-05 ENCOUNTER — Encounter: Payer: Self-pay | Admitting: Nurse Practitioner

## 2019-06-05 ENCOUNTER — Telehealth (INDEPENDENT_AMBULATORY_CARE_PROVIDER_SITE_OTHER): Payer: Medicaid Other | Admitting: Nurse Practitioner

## 2019-06-05 DIAGNOSIS — B009 Herpesviral infection, unspecified: Secondary | ICD-10-CM

## 2019-06-05 DIAGNOSIS — Z3009 Encounter for other general counseling and advice on contraception: Secondary | ICD-10-CM

## 2019-06-05 DIAGNOSIS — I10 Essential (primary) hypertension: Secondary | ICD-10-CM

## 2019-06-05 MED ORDER — ENALAPRIL MALEATE 10 MG PO TABS: 10.0000 mg | ORAL_TABLET | Freq: Every day | ORAL | 1 refills | Status: DC

## 2019-06-05 NOTE — Progress Notes (Signed)
TELEHEALTH POSTPARTUM VIRTUAL VIDEO VISIT ENCOUNTER NOTE   [facility]   I connected with Robyn Miller on 06/05/19 at  1:15 PM EDT by MyChart Video Encounter at home and verified that I am speaking with the correct person using two identifiers.    I discussed the limitations, risks, security and privacy concerns of performing an evaluation and management service virtually and the availability of in person appointments. I also discussed with the patient that there may be a patient responsible charge related to this service. The patient expressed understanding and agreed to proceed.  Chief Complaint: Postpartum Visit  History of Present Illness: Robyn Miller is a 23 y.o. African-American G2P1011 being evaluated for postpartum followup.    She is s/p SVD on 05/04/2019 at 4459w3d  she was discharged to home on 05/06/2019 . Pregnancy complicated by hypertension. Baby is doing well.  Complains of N/A  Vaginal bleeding or discharge: No  Intercourse: No  Contraception: no method Mode of feeding infant: Bottle PP depression s/s: No .  Any bowel or bladder issues: Yes  Pap smear: no abnormalities (date: 10/23/2018)  Review of Systems:  Her 12 point review of systems is negative or as noted in the History of Present Illness.  Patient Active Problem List   Diagnosis Date Noted  . Scoliosis 03/13/2019  . Essential hypertension 10/23/2018  . HSV infection 10/23/2018    Medications Robyn Miller had no medications administered during this visit. Current Outpatient Medications  Medication Sig Dispense Refill  . Prenatal Vit-Fe Fumarate-FA (MULTIVITAMIN-PRENATAL) 27-0.8 MG TABS tablet Take 1 tablet by mouth daily. 30 tablet 0  . enalapril (VASOTEC) 10 MG tablet Take 1 tablet (10 mg total) by mouth daily. 30 tablet 1   No current facility-administered medications for this visit.     Allergies Patient has no known allergies.   Physical Exam:  BP 129/89   Pulse (!) 102   LMP 08/03/2018 (Within Days)  Client is using  Babyscripts, but BP is not transferring into her chart.  Is taking BP weekly.  General:  Alert, oriented and cooperative. Patient is in no acute distress.  Mental Status: Normal mood and affect. Normal behavior. Normal judgment and thought content.   Respiratory: Normal respiratory effort noted, no problems with respiration noted  Rest of physical exam deferred due to type of encounter  PP Depression Screening:   Edinburgh Postnatal Depression Scale Screening Tool 06/05/2019  I have been able to laugh and see the funny side of things. 0  I have looked forward with enjoyment to things. 0  I have blamed myself unnecessarily when things went wrong. 0  I have been anxious or worried for no good reason. 0  I have felt scared or panicky for no good reason. 0  Things have been getting on top of me. 0  I have been so unhappy that I have had difficulty sleeping. 0  I have felt sad or miserable. 0  I have been so unhappy that I have been crying. 0  The thought of harming myself has occurred to me. 0  Edinburgh Postnatal Depression Scale Total 0     Assessment:Patient is a 23 y.o. G2P1011 who is 4 weeks postpartum from a spontaneous vaginal delivery from induction at 38 weeks.  She is doing well.  Hypertension Encounter for family planning counseling  Plan:  RTC 1 year Referral made to Internal Medicine for follow up for hypertension.  Continue to take medication.  Prescribed meds to her pharmacy. Discussion of contraception - client wants to use condoms.  Advised no pregnancy in next 18 months. Also discussed BMI and recommended her weight be under 160 pounds due to hypertension and maximizing her own health.  Currently does not have any way to weigh herself at home.  I discussed the assessment and treatment plan with the patient. The patient was provided an opportunity to ask questions and all were  answered. The patient agreed with the plan and demonstrated an understanding of the instructions.   The patient was advised to call back or seek an in-person evaluation/go to the ED for any concerning postpartum symptoms.  I provided 10 minutes of face-to-face time during this encounter.   Robyn Rochester, NP Center for [employer], [place]  Robyn Server, RN, MSN, NP-BC Nurse Practitioner, [facility] for [employer], [place] [facility] 06/05/2019 2:16 PM

## 2019-06-05 NOTE — Telephone Encounter (Signed)
°  Check out comments: Needs to be scheduled with Internal Medicine.Referral ordered

## 2019-06-11 ENCOUNTER — Encounter: Payer: Self-pay | Admitting: *Deleted

## 2019-06-11 ENCOUNTER — Other Ambulatory Visit: Payer: Self-pay | Admitting: *Deleted

## 2019-06-11 DIAGNOSIS — I10 Essential (primary) hypertension: Secondary | ICD-10-CM

## 2019-06-11 NOTE — Progress Notes (Signed)
MyChart message sent to pt informing her of referral appt to Primary Care @ [facility] for Hypertension management.

## 2019-07-05 ENCOUNTER — Telehealth: Payer: Self-pay

## 2019-07-05 NOTE — Telephone Encounter (Signed)
Called patient to do their pre-visit COVID screening.  Call went to voicemail. Unable to do prescreening.  

## 2019-07-08 ENCOUNTER — Ambulatory Visit: Payer: Medicaid Other

## 2019-07-09 ENCOUNTER — Other Ambulatory Visit: Payer: Self-pay | Admitting: *Deleted

## 2019-07-09 DIAGNOSIS — I10 Essential (primary) hypertension: Secondary | ICD-10-CM

## 2019-07-09 NOTE — Telephone Encounter (Signed)
Received multiple refill request faxes from [facility]  For Enalapril. Will route to provider. It appears she has been referred to PCP but not seen yet.  Jermane Brayboy,RN

## 2019-07-10 MED ORDER — ENALAPRIL MALEATE 10 MG PO TABS: 10.0000 mg | ORAL_TABLET | Freq: Every day | ORAL | 1 refills | Status: DC

## 2019-07-12 ENCOUNTER — Telehealth: Payer: Medicaid Other | Admitting: Physician Assistant

## 2019-07-12 ENCOUNTER — Encounter (INDEPENDENT_AMBULATORY_CARE_PROVIDER_SITE_OTHER): Payer: Self-pay | Admitting: Physician Assistant

## 2019-07-12 DIAGNOSIS — B3731 Acute candidiasis of vulva and vagina: Secondary | ICD-10-CM

## 2019-07-12 DIAGNOSIS — B373 Candidiasis of vulva and vagina: Secondary | ICD-10-CM | POA: Diagnosis not present

## 2019-07-12 MED ORDER — FLUCONAZOLE 150 MG PO TABS: 150.0000 mg | ORAL_TABLET | Freq: Once | ORAL | 0 refills | Status: AC

## 2019-07-12 NOTE — Progress Notes (Signed)
We are sorry that you are not feeling well. Here is how we plan to help! Based on what you shared with me it looks like you: May have a yeast vaginosis  Vaginosis is an inflammation of the vagina that can result in discharge, itching and pain. The cause is usually a change in the normal balance of vaginal bacteria or an infection. Vaginosis can also result from reduced estrogen levels after menopause.  The most common causes of vaginosis are:   Bacterial vaginosis which results from an overgrowth of one on several organisms that are normally present in your vagina.   Yeast infections which are caused by a naturally occurring fungus called candida.   Vaginal atrophy (atrophic vaginosis) which results from the thinning of the vagina from reduced estrogen levels after menopause.   Trichomoniasis which is caused by a parasite and is commonly transmitted by sexual intercourse.  Factors that increase your risk of developing vaginosis include: . Medications, such as antibiotics and steroids . Uncontrolled diabetes . Use of hygiene products such as bubble bath, vaginal spray or vaginal deodorant . Douching . Wearing damp or tight-fitting clothing . Using an intrauterine device (IUD) for birth control . Hormonal changes, such as those associated with pregnancy, birth control pills or menopause . Sexual activity . Having a sexually transmitted infection  Your treatment plan is A single Diflucan (fluconazole) 150mg tablet once.  I have electronically sent this prescription into the pharmacy that you have chosen.  Be sure to take all of the medication as directed. Stop taking any medication if you develop a rash, tongue swelling or shortness of breath. Mothers who are breast feeding should consider pumping and discarding their breast milk while on these antibiotics. However, there is no consensus that infant exposure at these doses would be harmful.  Remember that medication creams can weaken latex  condoms. .   HOME CARE:  Good hygiene may prevent some types of vaginosis from recurring and may relieve some symptoms:  . Avoid baths, hot tubs and whirlpool spas. Rinse soap from your outer genital area after a shower, and dry the area well to prevent irritation. Don't use scented or harsh soaps, such as those with deodorant or antibacterial action. . Avoid irritants. These include scented tampons and pads. . Wipe from front to back after using the toilet. Doing so avoids spreading fecal bacteria to your vagina.  Other things that may help prevent vaginosis include:  . Don't douche. Your vagina doesn't require cleansing other than normal bathing. Repetitive douching disrupts the normal organisms that reside in the vagina and can actually increase your risk of vaginal infection. Douching won't clear up a vaginal infection. . Use a latex condom. Both female and female latex condoms may help you avoid infections spread by sexual contact. . Wear cotton underwear. Also wear pantyhose with a cotton crotch. If you feel comfortable without it, skip wearing underwear to bed. Yeast thrives in moist environments Your symptoms should improve in the next day or two.  GET HELP RIGHT AWAY IF:  . You have pain in your lower abdomen ( pelvic area or over your ovaries) . You develop nausea or vomiting . You develop a fever . Your discharge changes or worsens . You have persistent pain with intercourse . You develop shortness of breath, a rapid pulse, or you faint.  These symptoms could be signs of problems or infections that need to be evaluated by a medical provider now.  MAKE SURE YOU      Understand these instructions.  Will watch your condition.  Will get help right away if you are not doing well or get worse.  Your e-visit answers were reviewed by a board certified advanced clinical practitioner to complete your personal care plan. Depending upon the condition, your plan could have included  both over the counter or prescription medications. Please review your pharmacy choice to make sure that you have choses a pharmacy that is open for you to pick up any needed prescription, Your safety is important to us. If you have drug allergies check your prescription carefully.   You can use MyChart to ask questions about today's visit, request a non-urgent call back, or ask for a work or school excuse for 24 hours related to this e-Visit. If it has been greater than 24 hours you will need to follow up with your provider, or enter a new e-Visit to address those concerns. You will get a MyChart message within the next two days asking about your experience. I hope that your e-visit has been valuable and will speed your recovery.  I spent 5-10 minutes on review and completion of this note- Sahar Osman PAC  

## 2019-07-15 ENCOUNTER — Telehealth: Payer: Self-pay | Admitting: *Deleted

## 2019-07-15 ENCOUNTER — Encounter: Payer: Self-pay | Admitting: *Deleted

## 2019-07-15 NOTE — Telephone Encounter (Signed)
Opened in error

## 2019-07-19 ENCOUNTER — Telehealth: Payer: Self-pay

## 2019-07-19 NOTE — Telephone Encounter (Signed)

## 2019-07-22 ENCOUNTER — Ambulatory Visit: Payer: Medicaid Other

## 2019-08-04 ENCOUNTER — Other Ambulatory Visit: Payer: Self-pay | Admitting: Nurse Practitioner

## 2019-08-04 DIAGNOSIS — I10 Essential (primary) hypertension: Secondary | ICD-10-CM

## 2019-08-09 ENCOUNTER — Telehealth: Payer: Self-pay

## 2019-08-09 NOTE — Telephone Encounter (Signed)
Called patient to do their pre-visit COVID screening.  Call went to voicemail. Unable to do prescreening.  

## 2019-08-12 ENCOUNTER — Ambulatory Visit: Payer: Medicaid Other | Admitting: Family Medicine

## 2019-08-14 DIAGNOSIS — R430 Anosmia: Secondary | ICD-10-CM | POA: Diagnosis not present

## 2019-08-14 DIAGNOSIS — U071 COVID-19: Secondary | ICD-10-CM | POA: Diagnosis not present

## 2019-08-14 DIAGNOSIS — Z20828 Contact with and (suspected) exposure to other viral communicable diseases: Secondary | ICD-10-CM | POA: Diagnosis not present

## 2019-08-14 DIAGNOSIS — R432 Parageusia: Secondary | ICD-10-CM | POA: Diagnosis not present

## 2020-07-13 ENCOUNTER — Ambulatory Visit (INDEPENDENT_AMBULATORY_CARE_PROVIDER_SITE_OTHER): Payer: Medicaid Other | Admitting: *Deleted

## 2020-07-13 ENCOUNTER — Other Ambulatory Visit: Payer: Self-pay

## 2020-07-13 DIAGNOSIS — Z3201 Encounter for pregnancy test, result positive: Secondary | ICD-10-CM | POA: Diagnosis not present

## 2020-07-13 DIAGNOSIS — Z3200 Encounter for pregnancy test, result unknown: Secondary | ICD-10-CM

## 2020-07-13 DIAGNOSIS — Z3A16 16 weeks gestation of pregnancy: Secondary | ICD-10-CM

## 2020-07-13 LAB — POCT PREGNANCY, URINE: Preg Test, Ur: POSITIVE — AB

## 2020-07-13 NOTE — Progress Notes (Signed)
Robyn Miller dropped off her urine today for her pregnancy test. Pregnancy test positive. I called [name] and informed her . She reports LMP 03/20/20 and is sure of date. This gives her EDD of 12/25/20 and is [redacted]w[redacted]d. I reviewed her medications with her and advised to take PNV ; which she informed me she is already taking PNV.  I instructed her to start prenatal care as soon as possible with provider of her choice. Prenatal providers  Lists placed in her AVS.  She did go to [place] before and does have hx of HTN.  She voices understanding. Orville Mena,RN

## 2020-07-13 NOTE — Patient Instructions (Signed)
[facility] Prenatal Care Providers          [facility] locations:  Hours may vary. Please call for an appointment  [facility] @ [facility]  [address] [phone]  [facility] @ [facility]   [address]  [phone]  [facility] @ [place]       [address] [phone]            [facility] @ [place]     [address] [phone]          [facility] @ [place]   [address] [phone]  [facility] @ [facility]  [address] [phone]     [facility] @ [facility]  [address]   [phone]  Prenatal Care Providers           [facility] @ [facility]  [address] [phone]  [facility] @ [facility]   [address]  [phone]  [facility] @ [place]       [address] [phone]            [facility] @ [place]     [address] [phone]          [facility] @ [place]   [address] [phone]  [facility] @ [facility]  [address] [phone]     [facility] @ [facility]  [address]   [phone]     [facility]  Phone: [phone]  [facility]  Phone: [phone]  [facility] Phone: [phone]  [facility] Phone: [phone]  [facility] Phone: [phone]  [facility] Phone: [phone]  [facility]  Phone: [phone]  

## 2020-07-14 ENCOUNTER — Ambulatory Visit: Payer: Medicaid Other

## 2020-07-16 NOTE — Progress Notes (Signed)
Patient was assessed and managed by nursing staff during this encounter. I have reviewed the chart and agree with the documentation and plan. I have also made any necessary editorial changes. ° °Tahra Hitzeman, MD °07/16/2020 9:39 PM   °

## 2020-07-19 ENCOUNTER — Encounter (HOSPITAL_COMMUNITY): Payer: Self-pay

## 2020-07-19 ENCOUNTER — Emergency Department (HOSPITAL_COMMUNITY)
Admission: EM | Admit: 2020-07-19 | Discharge: 2020-07-19 | Disposition: A | Discharge status: Home / Self Care | Payer: Medicaid Other | Attending: Emergency Medicine | Admitting: Emergency Medicine

## 2020-07-19 ENCOUNTER — Emergency Department (HOSPITAL_COMMUNITY): Payer: Medicaid Other

## 2020-07-19 ENCOUNTER — Other Ambulatory Visit: Payer: Self-pay

## 2020-07-19 DIAGNOSIS — O209 Hemorrhage in early pregnancy, unspecified: Secondary | ICD-10-CM | POA: Diagnosis not present

## 2020-07-19 DIAGNOSIS — I10 Essential (primary) hypertension: Secondary | ICD-10-CM | POA: Diagnosis not present

## 2020-07-19 DIAGNOSIS — Z3A08 8 weeks gestation of pregnancy: Secondary | ICD-10-CM | POA: Diagnosis not present

## 2020-07-19 DIAGNOSIS — Z79899 Other long term (current) drug therapy: Secondary | ICD-10-CM | POA: Insufficient documentation

## 2020-07-19 DIAGNOSIS — O021 Missed abortion: Secondary | ICD-10-CM | POA: Diagnosis not present

## 2020-07-19 DIAGNOSIS — Z3A17 17 weeks gestation of pregnancy: Secondary | ICD-10-CM | POA: Insufficient documentation

## 2020-07-19 LAB — BASIC METABOLIC PANEL
Anion gap: 10 (ref 5–15)
BUN: 6 mg/dL (ref 6–20)
CO2: 23 mmol/L (ref 22–32)
Calcium: 9.2 mg/dL (ref 8.9–10.3)
Chloride: 103 mmol/L (ref 98–111)
Creatinine, Ser: 0.62 mg/dL (ref 0.44–1.00)
GFR, Estimated: >60 mL/min (ref >60)
Glucose, Bld: 93 mg/dL (ref 70–99)
Potassium: 3.2 mmol/L — ABNORMAL LOW (ref 3.5–5.1)
Sodium: 136 mmol/L (ref 135–145)

## 2020-07-19 LAB — CBC
HCT: 37 % (ref 36.0–46.0)
Hemoglobin: 11.6 g/dL — ABNORMAL LOW (ref 12.0–15.0)
MCH: 25.1 pg — ABNORMAL LOW (ref 26.0–34.0)
MCHC: 31.4 g/dL (ref 30.0–36.0)
MCV: 80.1 fL (ref 80.0–100.0)
Platelets: 307 10*3/uL (ref 150–400)
RBC: 4.62 MIL/uL (ref 3.87–5.11)
RDW: 15.6 % — ABNORMAL HIGH (ref 11.5–15.5)
WBC: 6.5 10*3/uL (ref 4.0–10.5)
nRBC: 0 % (ref 0.0–0.2)

## 2020-07-19 LAB — WET PREP, GENITAL
Sperm: NONE SEEN
Trich, Wet Prep: NONE SEEN
Yeast Wet Prep HPF POC: NONE SEEN

## 2020-07-19 LAB — HCG, QUANTITATIVE, PREGNANCY: hCG, Beta Chain, Quant, S: 10292 m[IU]/mL — ABNORMAL HIGH (ref <5)

## 2020-07-19 NOTE — ED Provider Notes (Signed)
[facility]-EMERGENCY DEPT Provider Note   CSN: [id number] Arrival date & time: 07/19/20  5361     History No chief complaint on file.   Robyn Miller is a 24 y.o. female.  Robyn Miller is a 24 y.o. female who is currently estimated to be about [redacted] weeks pregnant, presents to the ED for vaginal bleeding.  She states that she just found out she was pregnant about a week ago, her last menstrual period was 03/30/2020, she has had a confirmatory pregnancy test at her OB office but has not had her first full prenatal visit or any ultrasound yet.  She states that about 5 days ago after her boyfriend and she were intermittent she noticed some bright red bleeding she states it was slightly more than spotting and that for the past several days she has noticed it now every time after she uses the bathroom.  She states she has to wear a pain liner denies any heavier bleeding or cramping.  She reports prior to this for the previous 2 weeks she is having some very light pink spotting intermittently.  She again denies any abdominal cramping or vaginal discharge.  She has not noted any passage of clots or products of conception.  She states she did not have similar problems with her previous pregnancy, but does have history of 1 prior spontaneous abortion.  She denies any other acute complaints.  No other aggravating or alleviating factors.  [facility] during this pregnancy.        Past Medical History:  Diagnosis Date  . Abscess   . Anemia   . Herpes   . Hypertension   . Scoliosis     Patient Active Problem List   Diagnosis Date Noted  . Scoliosis 03/13/2019  . Essential hypertension 10/23/2018  . HSV infection 10/23/2018    Past Surgical History:  Procedure Laterality Date  . WISDOM TOOTH EXTRACTION       OB History    Gravida  3   Para  1   Term  1   Preterm      AB  1   Living  1     SAB  1    TAB      Ectopic      Multiple  0   Live Births  1           Family History  Problem Relation Age of Onset  . Scoliosis Mother   . Hypertension Maternal Grandmother     Social History   Tobacco Use  . Smoking status: Never Smoker  . Smokeless tobacco: Never Used  Vaping Use  . Vaping Use: Former  . Substances: Flavoring  Substance Use Topics  . Alcohol use: Not Currently    Comment: occasional  . Drug use: Not Currently    Types: Marijuana    Comment: stopped with +UPT    Home Medications Prior to Admission medications   Medication Sig Start Date End Date Taking? Authorizing Provider  enalapril (VASOTEC) 10 MG tablet Take 1 tablet (10 mg total) by mouth daily. Patient not taking: Reported on 07/13/2020 07/10/19   Currie Paris, NP  Prenatal Vit-Fe Fumarate-FA (MULTIVITAMIN-PRENATAL) 27-0.8 MG TABS tablet Take 1 tablet by mouth daily. 05/06/19   Raelyn Mora, CNM  valACYclovir (VALTREX) 500 MG tablet Take 1 tablet by mouth 2 (two) times daily. Patient not taking: Reported on 07/13/2020  [provider]    Allergies    Patient has no known allergies.  Review of Systems   Review of Systems  Constitutional: Negative for chills and fever.  HENT: Negative.   Respiratory: Negative for cough and shortness of breath.   Cardiovascular: Negative for chest pain.  Gastrointestinal: Negative for abdominal pain, nausea and vomiting.  Genitourinary: Positive for vaginal bleeding. Negative for dysuria, frequency, pelvic pain and vaginal discharge.  Musculoskeletal: Negative for arthralgias and myalgias.  Skin: Negative for color change and rash.  Neurological: Negative for dizziness, syncope and light-headedness.  All other systems reviewed and are negative.   Physical Exam Updated Vital Signs BP (!) 150/81 (BP Location: Right Arm)   Pulse 75   Temp 98 F (36.7 C) (Oral)   Resp 16   SpO2 100%   Physical Exam Vitals and nursing note reviewed.    Constitutional:      General: She is not in acute distress.    Appearance: She is well-developed. She is not diaphoretic.  HENT:     Head: Normocephalic and atraumatic.  Eyes:     General:        Right eye: No discharge.        Left eye: No discharge.     Pupils: Pupils are equal, round, and reactive to light.  Cardiovascular:     Rate and Rhythm: Normal rate and regular rhythm.     Heart sounds: Normal heart sounds.  Pulmonary:     Effort: Pulmonary effort is normal. No respiratory distress.     Breath sounds: Normal breath sounds. No wheezing or rales.  Abdominal:     General: Bowel sounds are normal. There is no distension.     Palpations: Abdomen is soft. There is no mass.     Tenderness: There is no abdominal tenderness. There is no guarding.     Comments: Abdomen is soft and nontender, unable to palpate the uterine fundus over the pubic symphysis Nursing staff reported fetal heart tones of 134, but I am unable to get a consistent or reproducible fetal heart tone at all over the abdomen  Musculoskeletal:        General: No deformity.     Cervical back: Neck supple.  Skin:    General: Skin is warm and dry.     Capillary Refill: Capillary refill takes less than 2 seconds.  Neurological:     Mental Status: She is alert.     Coordination: Coordination normal.     Comments: Speech is clear, able to follow commands Moves extremities without ataxia, coordination intact  Psychiatric:        Mood and Affect: Mood normal.        Behavior: Behavior normal.     ED Results / Procedures / Treatments   Labs (all labs ordered are listed, but only abnormal results are displayed) Labs Reviewed  CBC - Abnormal; Notable for the following components:      Result Value   Hemoglobin 11.6 (*)    MCH 25.1 (*)    RDW 15.6 (*)    All other components within normal limits  BASIC METABOLIC PANEL - Abnormal; Notable for the following components:   Potassium 3.2 (*)    All other components  within normal limits  HCG, QUANTITATIVE, PREGNANCY - Abnormal; Notable for the following components:   hCG, Beta Chain, Quant, S 10,292 (*)    All other components within normal limits  WET PREP, GENITAL    EKG  None  Radiology US OB Limited > 14 wks  Result Date: 07/19/2020 CLINICAL DATA:  Pregnant patient with bleeding. EXAM: OBSTETRIC <14 WK [place] AND TRANSVAGINAL OB US TECHNIQUE: Both transabdominal and transvaginal ultrasound examinations were performed for complete evaluation of the gestation as well as the maternal uterus, adnexal regions, and pelvic cul-de-sac. Transvaginal technique was performed to assess early pregnancy. COMPARISON:  None. FINDINGS: Intrauterine gestational sac: There is a thin walled fluid-filled structure within the endometrium, likely a gestational sac. Yolk sac:  A normal yolk sac is not seen. Embryo:  A normal fetal pole is not seen. MSD: 30 mm   8 w   1 d CRL:    mm    w    d                  [place] EDC: Subchorionic hemorrhage:  None visualized. Maternal uterus/adnexae: The uterus measures 9.2 x 6.6 x 6.7 cm with a volume of 220 mm. The ovaries are normal in appearance. No free fluid in the pelvis. IMPRESSION: 1. A thin walled fluid collection is seen in the endometrium, likely a gestational sac. However, neither a normal yolk sac nor a normal fetal pole are identified. There are 2 echogenic structures within the apparent gestational sac with the largest measuring up to 18 mm. Neither of these structures appear to represent a yolk sac or fetal pole. Given a mean sac diameter of greater than 25 mm, the findings are consistent with a failed pregnancy. Findings meet definitive criteria for failed pregnancy. This follows SRU consensus guidelines: Diagnostic Criteria for Nonviable Pregnancy Early in the First Trimester. [facility] [phone]. Electronically Signed   By: Gerome Sam III M.D   On: 07/19/2020 13:11    Procedures Procedures (including critical care  time)  Medications Ordered in ED Medications - No data to display  ED Course  I have reviewed the triage vital signs and the nursing notes.  Pertinent labs & imaging results that were available during my care of the patient were reviewed by me and considered in my medical decision making (see chart for details).    MDM Rules/Calculators/A&P                         24 year old female presents, estimated to be [redacted] weeks pregnant based on LMP, she has been having some light vaginal bleeding now for a week without associated pain or cramping.  On physical exam she has no abdominal tenderness, I am not able to palpate the uterine fundus over the pubic symphysis which would be expected with 17 weeks of pregnancy, could be early her pregnancy.  On pelvic exam cervical os is closed with a small amount of bleeding, no products of conception.  I have independently ordered, reviewed and interpreted all labs and imaging:  Lab work shows stable hemoglobin, potassium of 3.2, encouraged to increase potassium in diet, but no other significant electrolyte derangements, hCG of 10,292, lower than expected for 17-week pregnancy, could be early her pregnancy versus signs of miscarriage.  Prior chart review reveals patient's blood type is a positive, she will not need RhoGam in the setting of vaginal bleeding.  Wet prep collected but patient did not want any STD testing.  Ultrasound shows a thin-walled fluid collection likely a gestational sac however there is no yolk sac or fetal pole, no cardiac activity noted, findings are consistent with a failed pregnancy.  Will discuss with OB/GYN.  Case discussed with Dr. Shawnie PonsPratt with OB/GYN faculty practice who states that patient may still miscarry on her own but otherwise will need to discuss options including Cytotec to help dilate cervix and allow abortion to have an on its own versus procedure.  Dr. Homero FellersFrank will send a message to the office to get patient in for close  follow-up early this week.  I have discussed findings with patient, she is tearful and expresses understanding.  She is aware that she may still develop cramping and heavier bleeding miscarry on her own, and will be waiting to hear from [facility] to discuss other options.  At this time she is stable for discharge home.  Answered all questions.  Ambulatory and in no acute distress at discharge.  Final Clinical Impression(s) / ED Diagnoses Final diagnoses:  Missed abortion    Rx / DC Orders ED Discharge Orders    None       Legrand RamsFord, Jorell Agne N, PA-C 07/20/20 1022    Linwood DibblesKnapp, Jon, MD 07/23/20 [phone]

## 2020-07-19 NOTE — Discharge Instructions (Addendum)
you should be receiving a call from OB/GYN likely tomorrow to schedule close follow-up appointment.  Your ultrasound shows that there is not a viable fetus but we do see a gestational sac in the uterus, we do not see any cardiac activity.    You may start to have heavier bleeding and cramping and may pass the products of conception on your own, but if not the OB/GYN office will talk to you about potential medications or procedures to help pass the products of conception.  If you develop severe bleeding or cramping this can be expected but if symptoms are not tolerable call your OB/GYN office or go to the [facility]U at the [facility] at [facility].

## 2020-07-19 NOTE — ED Triage Notes (Signed)
Pt arrived via walk in, c/o vaginal bleeding on and off x1 week. States she is [redacted] wks pregnant, due date March 25th, 2022. Denies any cramping or abd pain. States bleeding is worse after intercourse.

## 2020-07-20 ENCOUNTER — Telehealth: Payer: Self-pay | Admitting: *Deleted

## 2020-07-20 NOTE — Telephone Encounter (Signed)
Transition Care Management Follow-up Telephone Call  Date of discharge and from where: 07/19/20 - [facility]  How have you been since you were released from the hospital? "I am doing fine"  Any questions or concerns? No  Items Reviewed:  Did the pt receive and understand the discharge instructions provided? Yes   Medications obtained and verified? Yes   Other? No   Any new allergies since your discharge? No   Dietary orders reviewed? Yes  Do you have support at home? Yes   Home Care and Equipment/Supplies: Were home health services ordered? not applicable If so, what is the name of the agency? N/A  Has the agency set up a time to come to the patient's home? not applicable Were any new equipment or medical supplies ordered?  No What is the name of the medical supply agency? N/A Were you able to get the supplies/equipment? not applicable Do you have any questions related to the use of the equipment or supplies? No  Functional Questionnaire: (I = Independent and D = Dependent) ADLs: I  Bathing/Dressing- I  Meal Prep- I  Eating- I  Maintaining continence- I  Transferring/Ambulation- I  Managing Meds- I  Follow up appointments reviewed:   PCP Hospital f/u appt confirmed? No  - PEC Team to address  Specialist Hospital f/u appt confirmed? No    Are transportation arrangements needed? No   If their condition worsens, is the pt aware to call PCP or go to the Emergency Dept.? Yes  Was the patient provided with contact information for the PCP's office or ED? Yes  Was to pt encouraged to call back with questions or concerns? Yes

## 2020-07-22 ENCOUNTER — Other Ambulatory Visit: Payer: Self-pay

## 2020-07-22 ENCOUNTER — Encounter (HOSPITAL_BASED_OUTPATIENT_CLINIC_OR_DEPARTMENT_OTHER): Payer: Self-pay | Admitting: Obstetrics & Gynecology

## 2020-07-22 ENCOUNTER — Ambulatory Visit (INDEPENDENT_AMBULATORY_CARE_PROVIDER_SITE_OTHER): Payer: Medicaid Other | Admitting: Women's Health

## 2020-07-22 ENCOUNTER — Encounter: Payer: Self-pay | Admitting: Women's Health

## 2020-07-22 ENCOUNTER — Telehealth: Payer: Self-pay

## 2020-07-22 VITALS — BP 130/80 | HR 75 | Wt 169.0 lb

## 2020-07-22 DIAGNOSIS — O021 Missed abortion: Secondary | ICD-10-CM | POA: Diagnosis not present

## 2020-07-22 NOTE — Patient Instructions (Addendum)
Maternity Assessment Unit (MAU)  The Maternity Assessment Unit (MAU) is located at the [facility] at [facility]. The address is: [address], [address], [place], [place] [address]. Please see map below for additional directions.    The Maternity Assessment Unit is designed to help you during your pregnancy, and for up to 6 weeks after delivery, with any pregnancy- or postpartum-related emergencies, if you think you are in labor, or if your water has broken. For example, if you experience nausea and vomiting, vaginal bleeding, severe abdominal or pelvic pain, elevated blood pressure or other problems related to your pregnancy or postpartum time, please come to the Maternity Assessment Unit for assistance.          Miscarriage A miscarriage is the loss of an unborn baby (fetus) before the 20th week of pregnancy. Most miscarriages happen during the first 3 months of pregnancy. Sometimes, a miscarriage can happen before a woman knows that she is pregnant. Having a miscarriage can be an emotional experience. If you have had a miscarriage, talk with your health care provider about any questions you may have about miscarrying, the grieving process, and your plans for future pregnancy. What are the causes? A miscarriage may be caused by:  Problems with the genes or chromosomes of the fetus. These problems make it impossible for the baby to develop normally. They are often the result of random errors that occur early in the development of the baby, and are not passed from parent to child (not inherited).  Infection of the cervix or uterus.  Conditions that affect hormone balance in the body.  Problems with the cervix, such as the cervix opening and thinning before pregnancy is at term (cervical insufficiency).  Problems with the uterus. These may include: ? A uterus with an abnormal shape. ? Fibroids in the uterus. ? Congenital abnormalities. These are  problems that were present at birth.  Certain medical conditions.  Smoking, drinking alcohol, or using drugs.  Injury (trauma). In many cases, the cause of a miscarriage is not known. What are the signs or symptoms? Symptoms of this condition include:  Vaginal bleeding or spotting, with or without cramps or pain.  Pain or cramping in the abdomen or lower back.  Passing fluid, tissue, or blood clots from the vagina. How is this diagnosed? This condition may be diagnosed based on:  A physical exam.  Ultrasound.  Blood tests.  Urine tests. How is this treated? Treatment for a miscarriage is sometimes not necessary if you naturally pass all the tissue that was in your uterus. If necessary, this condition may be treated with:  Dilation and curettage (D&C). This is a procedure in which the cervix is stretched open and the lining of the uterus (endometrium) is scraped. This is done only if tissue from the fetus or placenta remains in the body (incomplete miscarriage).  Medicines, such as: ? Antibiotic medicine, to treat infection. ? Medicine to help the body pass any remaining tissue. ? Medicine to reduce (contract) the size of the uterus. These medicines may be given if you have a lot of bleeding. If you have Rh negative blood and your baby was Rh positive, you will need a shot of a medicine called Rh immunoglobulinto protect your future babies from Rh blood problems. "Rh-negative" and "Rh-positive" refer to whether or not the blood has a specific protein found on the surface of red blood cells (Rh factor). Follow these instructions at home: Medicines     Take over-the-counter and prescription medicines only as told by your health care provider.  If you were prescribed antibiotic medicine, take it as told by your health care provider. Do not stop taking the antibiotic even if you start to feel better.  Do not take NSAIDs, such as aspirin and ibuprofen, unless they are approved by  your health care provider. These medicines can cause bleeding. Activity  Rest as directed. Ask your health care provider what activities are safe for you.  Have someone help with home and family responsibilities during this time. General instructions  Keep track of the number of sanitary pads you use each day and how soaked (saturated) they are. Write down this information.  Monitor the amount of tissue or blood clots that you pass from your vagina. Save any large amounts of tissue for your health care provider to examine.  Do not use tampons, douche, or have sex until your health care provider approves.  To help you and your partner with the process of grieving, talk with your health care provider or seek counseling.  When you are ready, meet with your health care provider to discuss any important steps you should take for your health. Also, discuss steps you should take to have a healthy pregnancy in the future.  Keep all follow-up visits as told by your health care provider. This is important. Where to find more information  The American Congress of Obstetricians and Gynecologists: www.acog.org  U.S. Department of Health and Human Services Office of Women's Health: www.womenshealth.gov Contact a health care provider if:  You have a fever or chills.  You have a foul smelling vaginal discharge.  You have more bleeding instead of less. Get help right away if:  You have severe cramps or pain in your back or abdomen.  You pass blood clots or tissue from your vagina that is walnut-sized or larger.  You soak more than 1 regular sanitary pad in an hour.  You become light-headed or weak.  You pass out.  You have feelings of sadness that take over your thoughts, or you have thoughts of hurting yourself. Summary  Most miscarriages happen in the first 3 months of pregnancy. Sometimes miscarriage happens before a woman even knows that she is pregnant.  Follow your health care  provider's instruction for home care. Keep all follow-up appointments.  To help you and your partner with the process of grieving, talk with your health care provider or seek counseling. This information is not intended to replace advice given to you by your health care provider. Make sure you discuss any questions you have with your health care provider. Document Revised: 01/11/2019 Document Reviewed: 10/25/2016 Elsevier Patient Education  2020 Elsevier Inc.        Managing Pregnancy Loss Pregnancy loss can happen any time during a pregnancy. Often the cause is not known. It is rarely because of anything you did. Pregnancy loss in early pregnancy (during the first trimester) is called a miscarriage. This type of pregnancy loss is the most common. Pregnancy loss that happens after 20 weeks of pregnancy is called fetal demise if the baby's heart stops beating before birth. Fetal demise is much less common. Some women experience spontaneous labor shortly after fetal demise resulting in a stillborn birth (stillbirth). Any pregnancy loss can be devastating. You will need to recover both physically and emotionally. Most women are able to get pregnant again after a pregnancy loss and deliver a healthy baby. How to manage emotional recovery    Pregnancy loss is very hard emotionally. You may feel many different emotions while you grieve. You may feel sad and angry. You may also feel guilty. It is normal to have periods of crying. Emotional recovery can take longer than physical recovery. It is different for everyone. Taking these steps can help you in managing this loss:  Remember that it is unlikely you did anything to cause the pregnancy loss.  Share your thoughts and feelings with friends, family, and your partner. Remember that your partner is also recovering emotionally.  Make sure you have a good support system. Do not spend too much time alone.  Meet with a pregnancy loss counselor or join  a pregnancy loss support group.  Get enough sleep and eat a healthy diet. Return to regular exercise when you have recovered physically.  Do not use drugs or alcohol to manage your emotions.  Consider seeing a mental health professional to help you recover emotionally.  Ask a friend or loved one to help you decide what to do with any clothing and nursery items you received for your baby. In the case of a stillbirth, many women benefit from taking additional steps in the grieving process. You may want to:  Hold your baby after the birth.  Name your baby.  Request a birth certificate.  Create a keepsake such as handprints or footprints.  Dress your baby and have a picture taken.  Make funeral arrangements.  Ask for a baptism or blessing. Hospitals have staff members who can help you with all these arrangements. How to recognize emotional stress It is normal to have emotional stress after a pregnancy loss. But emotional stress that lasts a long time or becomes severe requires treatment. Watch out for these signs of severe emotional stress:  Sadness, anger, or guilt that is not going away and is interfering with your normal activities.  Relationship problems that have occurred or gotten worse since the pregnancy loss.  Signs of depression that last longer than 2 weeks. These may include: ? Sadness. ? Anxiety. ? Hopelessness. ? Loss of interest in activities you enjoy. ? Inability to concentrate. ? Trouble sleeping or sleeping too much. ? Loss of appetite or overeating. ? Thoughts of death or of hurting yourself. Follow these instructions at home:  Take over-the-counter and prescription medicines only as told by your health care provider.  Rest at home until your energy level returns. Return to your normal activities as told by your health care provider. Ask your health care provider what activities are safe for you.  When you are ready, meet with your health care provider  to discuss steps to take for a future pregnancy.  Keep all follow-up visits as told by your health care provider. This is important. Where to find support  To help you and your partner with the process of grieving, talk with your health care provider or seek counseling.  Consider meeting with others who have experienced pregnancy loss. Ask your health care provider about support groups and resources. Where to find more information  U.S. Department of Health and Human Services Office on Women's Health: www.womenshealth.gov  American Pregnancy Association: www.americanpregnancy.org Contact a health care provider if:  You continue to experience grief, sadness, or lack of motivation for everyday activities, and those feelings do not improve over time.  You are struggling to recover emotionally, especially if you are using alcohol or substances to help. Get help right away if:  You have thoughts of hurting yourself or others.   If you ever feel like you may hurt yourself or others, or have thoughts about taking your own life, get help right away. You can go to your nearest emergency department or call:  Your local emergency services (911 in the [place]  A suicide crisis helpline, such as the National Suicide Prevention Lifeline at 1-800-273-8255. This is open 24 hours a day. Summary  Any pregnancy loss can be difficult physically and emotionally.  You may experience many different emotions while you grieve. Emotional recovery can last longer than physical recovery.  It is normal to have emotional stress after a pregnancy loss. But emotional stress that lasts a long time or becomes severe requires treatment.  See your health care provider if you are struggling emotionally after a pregnancy loss. This information is not intended to replace advice given to you by your health care provider. Make sure you discuss any questions you have with your health care provider. Document Revised:  01/09/2019 Document Reviewed: 11/30/2017 Elsevier Patient Education  2020 Elsevier Inc.  

## 2020-07-22 NOTE — Progress Notes (Signed)
  History:  [name] is a 24 y.o. G3P1011 who presents to clinic today for f/u on a miscarriage that was diagnosed at the emergency department on 07/19/2020. Patient was 17w by LMP, but was found to be [redacted]w[redacted]d by MSD. Patient reports she has passed some small clots, but denies pain at this time.  The following portions of the patient's history were reviewed and updated as appropriate: allergies, current medications, family history, past medical history, social history, past surgical history and problem list.  Review of Systems:  Review of Systems  Constitutional: Negative for chills and fever.  Respiratory: Negative for shortness of breath.   Cardiovascular: Negative for chest pain.  Gastrointestinal: Negative for abdominal pain, nausea and vomiting.  Genitourinary: Negative for dysuria.       +VB     Objective:  Physical Exam BP 130/80   Pulse 75   Wt 169 lb (76.7 kg)   BMI 24.96 kg/m  Physical Exam Vitals reviewed.  Constitutional:      General: She is not in acute distress.    Appearance: Normal appearance. She is normal weight. She is not ill-appearing, toxic-appearing or diaphoretic.  HENT:     Head: Normocephalic and atraumatic.  Pulmonary:     Effort: Pulmonary effort is normal.  Neurological:     Mental Status: She is alert and oriented to person, place, and time.  Psychiatric:        Mood and Affect: Mood normal.        Behavior: Behavior normal.        Thought Content: Thought content normal.        Judgment: Judgment normal.    Labs and Imaging No results found for this or any previous visit (from the past 24 hour(s)).  No results found.   Assessment & Plan:  1. Missed abortion -reviewed options for missed AB including expectant management, cytotec at home, scheduled D&C -pt requests D&C as she does not want to experience the process at home -called [facility] for in-office MVA, no appts available this week; MVA not available at [employer],  [place] -message sent to [employer] to schedule Marijean Niemann from [facility] in to see patient while present in office -reviewed that while waiting for [facility] it is possible to experience miscarriage at home, reviewed expected s/sx and when to present to MAU -discussed pain management at home with alternating Tylenol and ibuprofen, pt declines offer for prescription pain management at this time  Approximately 10 minutes of total time was spent with this patient on counseling and coordination of care.  Marylen Ponto, NP 07/22/2020 9:47 AM

## 2020-07-22 NOTE — Telephone Encounter (Signed)
-----   Message from Marylen Ponto, NP sent at 07/22/2020  9:06 AM EDT ----- Regarding: D&C Good morning,  This patient was found to have an 8 week pregnancy by MSD and is requesting a D&C as soon as possible. She is really struggling with this loss.  Thank you, Joni Reining

## 2020-07-22 NOTE — Telephone Encounter (Signed)
Called and spoke to Ms. Christin Fudge, surgery date time and preop instructions given. Surgery scheduled for 10/22 at [facility] at 10:30am, arrive at 9:00am, NPO after midnight, no lotion, no powder,no perfume, remove all jewelry, have someone available to drive you.  Must go get covid tested today, address given [address]  No questions, expressed understaning, office number given to give [place] a call back if she had any questions.

## 2020-07-23 ENCOUNTER — Other Ambulatory Visit (HOSPITAL_COMMUNITY)
Admission: RE | Admit: 2020-07-23 | Discharge: 2020-07-23 | Disposition: A | Discharge status: Home / Self Care | Payer: Medicaid Other | Source: Ambulatory Visit | Attending: Obstetrics & Gynecology | Admitting: Obstetrics & Gynecology

## 2020-07-23 DIAGNOSIS — Z20822 Contact with and (suspected) exposure to covid-19: Secondary | ICD-10-CM | POA: Insufficient documentation

## 2020-07-23 DIAGNOSIS — Z01812 Encounter for preprocedural laboratory examination: Secondary | ICD-10-CM | POA: Diagnosis not present

## 2020-07-23 LAB — SARS CORONAVIRUS 2 (TAT 6-24 HRS): SARS Coronavirus 2: NEGATIVE

## 2020-07-24 ENCOUNTER — Ambulatory Visit (HOSPITAL_BASED_OUTPATIENT_CLINIC_OR_DEPARTMENT_OTHER)
Admission: RE | Admit: 2020-07-24 | Discharge: 2020-07-24 | Disposition: A | Discharge status: Home / Self Care | Payer: Medicaid Other | Attending: Obstetrics & Gynecology | Admitting: Obstetrics & Gynecology

## 2020-07-24 ENCOUNTER — Encounter (HOSPITAL_BASED_OUTPATIENT_CLINIC_OR_DEPARTMENT_OTHER): Payer: Self-pay | Admitting: Obstetrics & Gynecology

## 2020-07-24 ENCOUNTER — Ambulatory Visit (HOSPITAL_BASED_OUTPATIENT_CLINIC_OR_DEPARTMENT_OTHER): Payer: Medicaid Other | Admitting: Anesthesiology

## 2020-07-24 ENCOUNTER — Other Ambulatory Visit: Payer: Self-pay

## 2020-07-24 ENCOUNTER — Encounter (HOSPITAL_BASED_OUTPATIENT_CLINIC_OR_DEPARTMENT_OTHER)
Admission: RE | Disposition: A | Discharge status: Home / Self Care | Payer: Self-pay | Source: Home / Self Care | Attending: Obstetrics & Gynecology

## 2020-07-24 DIAGNOSIS — Z3A08 8 weeks gestation of pregnancy: Secondary | ICD-10-CM | POA: Insufficient documentation

## 2020-07-24 DIAGNOSIS — B9689 Other specified bacterial agents as the cause of diseases classified elsewhere: Secondary | ICD-10-CM | POA: Diagnosis present

## 2020-07-24 DIAGNOSIS — O021 Missed abortion: Secondary | ICD-10-CM | POA: Diagnosis present

## 2020-07-24 DIAGNOSIS — N760 Acute vaginitis: Secondary | ICD-10-CM | POA: Diagnosis present

## 2020-07-24 HISTORY — DX: Gestational (pregnancy-induced) hypertension without significant proteinuria, unspecified trimester: O13.9

## 2020-07-24 HISTORY — PX: DILATION AND EVACUATION: SHX1459

## 2020-07-24 SURGERY — DILATION AND EVACUATION, UTERUS
Anesthesia: General | Site: Vagina

## 2020-07-24 MED ORDER — DOCUSATE SODIUM 100 MG PO CAPS: 100.0000 mg | ORAL_CAPSULE | Freq: Two times a day (BID) | ORAL | 2 refills | Status: AC | PRN

## 2020-07-24 MED ORDER — ONDANSETRON HCL 4 MG/2ML IJ SOLN
INTRAMUSCULAR | Status: AC
  Filled 2020-07-24: qty 2

## 2020-07-24 MED ORDER — MIDAZOLAM HCL 5 MG/5ML IJ SOLN
INTRAMUSCULAR | Status: DC | PRN
  Administered 2020-07-24: 2 mg via INTRAVENOUS

## 2020-07-24 MED ORDER — PROPOFOL 10 MG/ML IV BOLUS
INTRAVENOUS | Status: DC | PRN
  Administered 2020-07-24: 150 mg via INTRAVENOUS
  Administered 2020-07-24: 30 mg via INTRAVENOUS
  Administered 2020-07-24: 20 mg via INTRAVENOUS

## 2020-07-24 MED ORDER — FENTANYL CITRATE (PF) 100 MCG/2ML IJ SOLN
INTRAMUSCULAR | Status: AC
  Filled 2020-07-24: qty 2

## 2020-07-24 MED ORDER — DEXAMETHASONE SODIUM PHOSPHATE 10 MG/ML IJ SOLN
INTRAMUSCULAR | Status: AC
  Filled 2020-07-24: qty 1

## 2020-07-24 MED ORDER — ONDANSETRON HCL 4 MG/2ML IJ SOLN: 4.0000 mg | Freq: Once | INTRAMUSCULAR | Status: DC | PRN

## 2020-07-24 MED ORDER — DOXYCYCLINE HYCLATE 100 MG IV SOLR
200.0000 mg | INTRAVENOUS | Status: AC
  Administered 2020-07-24 (×2): 100 mg via INTRAVENOUS
  Administered 2020-07-24: 200 mg via INTRAVENOUS
  Filled 2020-07-24: qty 200

## 2020-07-24 MED ORDER — LACTATED RINGERS IV SOLN: INTRAVENOUS | Status: DC

## 2020-07-24 MED ORDER — DEXAMETHASONE SODIUM PHOSPHATE 4 MG/ML IJ SOLN
INTRAMUSCULAR | Status: DC | PRN
  Administered 2020-07-24: 10 mg via INTRAVENOUS

## 2020-07-24 MED ORDER — LIDOCAINE HCL (CARDIAC) PF 100 MG/5ML IV SOSY
PREFILLED_SYRINGE | INTRAVENOUS | Status: DC | PRN
  Administered 2020-07-24: 100 mg via INTRAVENOUS

## 2020-07-24 MED ORDER — LIDOCAINE 2% (20 MG/ML) 5 ML SYRINGE
INTRAMUSCULAR | Status: AC
  Filled 2020-07-24: qty 5

## 2020-07-24 MED ORDER — PROPOFOL 10 MG/ML IV BOLUS
INTRAVENOUS | Status: AC
  Filled 2020-07-24: qty 20

## 2020-07-24 MED ORDER — ONDANSETRON HCL 4 MG/2ML IJ SOLN
INTRAMUSCULAR | Status: DC | PRN
  Administered 2020-07-24: 4 mg via INTRAVENOUS

## 2020-07-24 MED ORDER — METRONIDAZOLE 500 MG PO TABS: 500.0000 mg | ORAL_TABLET | Freq: Two times a day (BID) | ORAL | 0 refills | Status: AC

## 2020-07-24 MED ORDER — HYDROMORPHONE HCL 1 MG/ML IJ SOLN: 0.2500 mg | INTRAMUSCULAR | Status: DC | PRN

## 2020-07-24 MED ORDER — MIDAZOLAM HCL 2 MG/2ML IJ SOLN
INTRAMUSCULAR | Status: AC
  Filled 2020-07-24: qty 2

## 2020-07-24 MED ORDER — OXYCODONE HCL 5 MG PO TABS
5.0000 mg | ORAL_TABLET | ORAL | 0 refills | Status: AC | PRN
Start: 2020-07-24 — End: ?

## 2020-07-24 MED ORDER — SODIUM CHLORIDE 0.9 % IV SOLN
INTRAVENOUS | Status: AC
  Filled 2020-07-24 (×2): qty 100

## 2020-07-24 MED ORDER — ACETAMINOPHEN 10 MG/ML IV SOLN: 1000.0000 mg | Freq: Once | INTRAVENOUS | Status: DC | PRN

## 2020-07-24 MED ORDER — BUPIVACAINE HCL 0.5 % IJ SOLN
INTRAMUSCULAR | Status: DC | PRN
  Administered 2020-07-24: 30 mL

## 2020-07-24 MED ORDER — FENTANYL CITRATE (PF) 100 MCG/2ML IJ SOLN
INTRAMUSCULAR | Status: DC | PRN
  Administered 2020-07-24: 25 ug via INTRAVENOUS
  Administered 2020-07-24: 50 ug via INTRAVENOUS
  Administered 2020-07-24: 25 ug via INTRAVENOUS

## 2020-07-24 MED ORDER — KETOROLAC TROMETHAMINE 30 MG/ML IJ SOLN
INTRAMUSCULAR | Status: DC | PRN
  Administered 2020-07-24: 30 mg via INTRAVENOUS

## 2020-07-24 MED ORDER — IBUPROFEN 600 MG PO TABS: 600.0000 mg | ORAL_TABLET | Freq: Four times a day (QID) | ORAL | 2 refills | Status: AC | PRN

## 2020-07-24 MED ORDER — KETOROLAC TROMETHAMINE 30 MG/ML IJ SOLN
INTRAMUSCULAR | Status: AC
  Filled 2020-07-24: qty 1

## 2020-07-24 SURGICAL SUPPLY — 26 items
CATH ROBINSON RED A/P 14FR (CATHETERS) ×3 IMPLANT
DECANTER SPIKE VIAL GLASS SM (MISCELLANEOUS) ×3 IMPLANT
FILTER UTR ASPR ASSEMBLY (MISCELLANEOUS) IMPLANT
GLOVE BIO SURGEON STRL SZ 6.5 (GLOVE) ×2 IMPLANT
GLOVE BIO SURGEON STRL SZ7 (GLOVE) ×3 IMPLANT
GLOVE BIO SURGEONS STRL SZ 6.5 (GLOVE) ×1
GLOVE BIOGEL PI IND STRL 7.0 (GLOVE) ×1 IMPLANT
GLOVE BIOGEL PI INDICATOR 7.0 (GLOVE) ×2
GLOVE ECLIPSE 7.0 STRL STRAW (GLOVE) ×3 IMPLANT
GOWN STRL REUS W/ TWL LRG LVL3 (GOWN DISPOSABLE) ×1 IMPLANT
GOWN STRL REUS W/TWL LRG LVL3 (GOWN DISPOSABLE) ×6 IMPLANT
HOSE CONNECTING 18IN BERKELEY (TUBING) ×3 IMPLANT
KIT BERKELEY 1ST TRI 3/8 NO TR (MISCELLANEOUS) ×3 IMPLANT
KIT BERKELEY 1ST TRIMESTER 3/8 (MISCELLANEOUS) ×3 IMPLANT
NS IRRIG 1000ML POUR BTL (IV SOLUTION) ×3 IMPLANT
PACK VAGINAL MINOR WOMEN LF (CUSTOM PROCEDURE TRAY) ×3 IMPLANT
PAD OB MATERNITY 4.3X12.25 (PERSONAL CARE ITEMS) ×3 IMPLANT
PAD PREP 24X48 CUFFED NSTRL (MISCELLANEOUS) ×3 IMPLANT
SET BERKELEY SUCTION TUBING (SUCTIONS) ×3 IMPLANT
SLEEVE SCD COMPRESS KNEE MED (MISCELLANEOUS) ×3 IMPLANT
TOWEL GREEN STERILE FF (TOWEL DISPOSABLE) ×3 IMPLANT
VACURETTE 10 RIGID CVD (CANNULA) IMPLANT
VACURETTE 6 ASPIR F TIP BERK (CANNULA) IMPLANT
VACURETTE 7MM CVD STRL WRAP (CANNULA) IMPLANT
VACURETTE 8 RIGID CVD (CANNULA) ×3 IMPLANT
VACURETTE 9 RIGID CVD (CANNULA) IMPLANT

## 2020-07-24 NOTE — Anesthesia Procedure Notes (Signed)
Procedure Name: LMA Insertion Date/Time: 07/24/2020 10:46 AM Performed by: Jessica Priest, CRNA Pre-anesthesia Checklist: Patient identified, Emergency Drugs available, Suction available, Patient being monitored and Timeout performed Patient Re-evaluated:Patient Re-evaluated prior to induction Oxygen Delivery Method: Circle system utilized Preoxygenation: Pre-oxygenation with 100% oxygen Induction Type: IV induction Ventilation: Mask ventilation without difficulty LMA: LMA inserted LMA Size: 4.0 Number of attempts: 1 Airway Equipment and Method: Bite block Placement Confirmation: positive ETCO2,  breath sounds checked- equal and bilateral and CO2 detector Tube secured with: Tape Dental Injury: Teeth and Oropharynx as per pre-operative assessment

## 2020-07-24 NOTE — Transfer of Care (Signed)
Immediate Anesthesia Transfer of Care Note  Patient: Robyn Miller  Procedure(s) Performed: Procedure(s) (LRB): DILATATION AND EVACUATION (N/A)  Patient Location: PACU  Anesthesia Type: General  Level of Consciousness: awake, sedated, patient cooperative and responds to stimulation  Airway & Oxygen Therapy: Patient Spontanous Breathing and Patient connected to [place] 02 and soft FM   Post-op Assessment: Report given to PACU RN, Post -op Vital signs reviewed and stable and Patient moving all extremities  Post vital signs: Reviewed and stable  Complications: No apparent anesthesia complications

## 2020-07-24 NOTE — Anesthesia Preprocedure Evaluation (Signed)
Anesthesia Evaluation  Patient identified by MRN, date of birth, ID band Patient awake    Airway Mallampati: II  TM Distance: >3 FB Neck ROM: Full    Dental  (+) Teeth Intact   Pulmonary neg pulmonary ROS,    Pulmonary exam normal        Cardiovascular hypertension, negative cardio ROS   Rhythm:Regular Rate:Normal     Neuro/Psych negative neurological ROS  negative psych ROS   GI/Hepatic negative GI ROS, Neg liver ROS,   Endo/Other  negative endocrine ROS  Renal/GU negative Renal ROS   HSV negative genitourinary   Musculoskeletal negative musculoskeletal ROS (+) scoliosis   Abdominal (+)  Abdomen: soft. Bowel sounds: normal.  Peds  Hematology  (+) anemia ,   Anesthesia Other Findings   Reproductive/Obstetrics negative OB ROS Failed pregnancy                             Anesthesia Physical Anesthesia Plan  ASA: II  Anesthesia Plan: General   Post-op Pain Management:    Induction: Intravenous  PONV Risk Score and Plan: 3 and Ondansetron, Dexamethasone, Midazolam and Treatment may vary due to age or medical condition  Airway Management Planned: Mask and LMA  Additional Equipment: None  Intra-op Plan:   Post-operative Plan: Extubation in OR  Informed Consent: I have reviewed the patients History and Physical, chart, labs and discussed the procedure including the risks, benefits and alternatives for the proposed anesthesia with the patient or authorized representative who has indicated his/her understanding and acceptance.     Dental advisory given  Plan Discussed with: CRNA and Surgeon  Anesthesia Plan Comments:         Anesthesia Quick Evaluation

## 2020-07-24 NOTE — Anesthesia Postprocedure Evaluation (Signed)
Anesthesia Post Note  Patient: Robyn Miller  Procedure(s) Performed: DILATATION AND EVACUATION (N/A Vagina )     Patient location during evaluation: PACU Anesthesia Type: General Level of consciousness: awake and alert Pain management: pain level controlled Vital Signs Assessment: post-procedure vital signs reviewed and stable Respiratory status: spontaneous breathing, nonlabored ventilation, respiratory function stable and patient connected to nasal cannula oxygen Cardiovascular status: blood pressure returned to baseline and stable Postop Assessment: no apparent nausea or vomiting Anesthetic complications: no   No complications documented.  Last Vitals:  Vitals:   07/24/20 1200 07/24/20 1230  BP: (!) 149/93 (!) 133/95  Pulse: 65 71  Resp: 16   Temp:  36.5 C  SpO2: 100% 100%    Last Pain:  Vitals:   07/24/20 1230  TempSrc: Oral  PainSc: 0-No pain                 Earl Lites P Dorthia Tout

## 2020-07-24 NOTE — Discharge Instructions (Addendum)
Dilation and Curettage or Vacuum Curettage, Care After This sheet gives you information about how to care for yourself after your procedure. Your health care provider may also give you more specific instructions. If you have problems or questions, contact your health care provider. What can I expect after the procedure? After your procedure, it is common to have:  Mild pain or cramping.  Some vaginal bleeding or spotting. These may last for up to 2 weeks after your procedure. Follow these instructions at home: Activity   Do not drive or use heavy machinery while taking prescription pain medicine.  Avoid driving for the first 24 hours after your procedure.  Take frequent, short walks, followed by rest periods, throughout the day. Ask your health care provider what activities are safe for you. After 1-2 days, you may be able to return to your normal activities.  Do not lift anything heavier than 10 lb (4.5 kg) until your health care provider approves.  For at least 2 weeks, or as long as told by your health care provider, do not: ? Douche. ? Use tampons. ? Have sexual intercourse. General instructions   Take over-the-counter and prescription medicines only as told by your health care provider. This is especially important if you take blood thinning medicine.  Do not take baths, swim, or use a hot tub until your health care provider approves. Take showers instead of baths.  Wear compression stockings as told by your health care provider. These stockings help to prevent blood clots and reduce swelling in your legs.  It is your responsibility to get the results of your procedure. Ask your health care provider, or the department performing the procedure, when your results will be ready.  Keep all follow-up visits as told by your health care provider. This is important. Contact a health care provider if:  You have severe cramps that get worse or that do not get better with  medicine.  You have severe abdominal pain.  You cannot drink fluids without vomiting.  You develop pain in a different area of your pelvis.  You have bad-smelling vaginal discharge.  You have a rash. Get help right away if:  You have vaginal bleeding that soaks more than one sanitary pad in 1 hour, for 2 hours in a row.  You pass large blood clots from your vagina.  You have a fever that is above 100.4F (38.0C).  Your abdomen feels very tender or hard.  You have chest pain.  You have shortness of breath.  You cough up blood.  You feel dizzy or light-headed.  You faint.  You have pain in your neck or shoulder area. This information is not intended to replace advice given to you by your health care provider. Make sure you discuss any questions you have with your health care provider. Document Revised: 09/01/2017 Document Reviewed: 04/21/2016 Elsevier Patient Education  2020 Elsevier Inc.  Post Anesthesia Home Care Instructions  Activity: Get plenty of rest for the remainder of the day. A responsible individual must stay with you for 24 hours following the procedure.  For the next 24 hours, DO NOT: -Drive a car -Operate machinery -Drink alcoholic beverages -Take any medication unless instructed by your physician -Make any legal decisions or sign important papers.  Meals: Start with liquid foods such as gelatin or soup. Progress to regular foods as tolerated. Avoid greasy, spicy, heavy foods. If nausea and/or vomiting occur, drink only clear liquids until the nausea and/or vomiting subsides. Call your physician   if vomiting continues.  Special Instructions/Symptoms: Your throat may feel dry or sore from the anesthesia or the breathing tube placed in your throat during surgery. If this causes discomfort, gargle with warm salt water. The discomfort should disappear within 24 hours.  If you had a scopolamine patch placed behind your ear for the management of post-  operative nausea and/or vomiting:  1. The medication in the patch is effective for 72 hours, after which it should be removed.  Wrap patch in a tissue and discard in the trash. Wash hands thoroughly with soap and water. 2. You may remove the patch earlier than 72 hours if you experience unpleasant side effects which may include dry mouth, dizziness or visual disturbances. 3. Avoid touching the patch. Wash your hands with soap and water after contact with the patch.      

## 2020-07-24 NOTE — Op Note (Signed)
Robyn Miller PROCEDURE DATE: 07/24/2020  PREOPERATIVE DIAGNOSIS: First trimester missed abortion POSTOPERATIVE DIAGNOSIS: The same PROCEDURE:     Dilation and Evacuation SURGEON:  Dr. Jaynie Collins  INDICATIONS: 24 y.o. G3P1011 with MAB (IUGS at [redacted] weeks gestation) desiring surgical completion.  Risks of surgery were discussed with the patient including but not limited to: bleeding which may require transfusion; infection which may require antibiotics; injury to uterus or surrounding organs; need for additional procedures including laparotomy or laparoscopy; possibility of intrauterine scarring which may impair future fertility; and other postoperative/anesthesia complications. Written informed consent was obtained.    FINDINGS:  A 8 week size uterus, moderate amounts of products of conception, specimen sent to pathology.  ANESTHESIA: General-LMA, paracervical block. INTRAVENOUS FLUIDS:  400 ml of LR ESTIMATED BLOOD LOSS:  25 ml. SPECIMENS:  Products of conception sent to pathology COMPLICATIONS:  None immediate.  PROCEDURE DETAILS:  The patient received intravenous Doxycycline while in the preoperative area.  She was then taken to the operating room where general anesthesia was administered and was found to be adequate.  After an adequate timeout was performed, she was placed in the dorsal lithotomy position and examined; then prepped and draped in the sterile manner.   Her bladder was catheterized for an unmeasured amount of clear, yellow urine. A vaginal speculum was then placed in the patient's vagina and a single tooth tenaculum was applied to the anterior lip of the cervix.  A paracervical block using 30 ml of 0.5% Marcaine was administered. The cervix was gently dilated to accommodate a 8 mm suction curette that was gently advanced to the uterine fundus.  The suction device was then activated and curette slowly rotated to clear the uterus of products of conception. Complete emptying of the  uterus was noted. There was minimal bleeding noted and the tenaculum removed with good hemostasis noted.   All instruments were removed from the patient's vagina.  Sponge and instrument counts were correct times two  The patient tolerated the procedure well and was taken to the recovery area extubated, awake, and in stable condition.  The patient will be discharged to home as per PACU criteria.  Routine postoperative instructions given.  She was prescribed Oxycodone, Ibuprofen and Colace. Also given Metronidazole for +BV on recent vaginitis screen.  She will follow up in the office in 2-3 weeks for postoperative evaluation.   Jaynie Collins, MD, FACOG Obstetrician & Gynecologist, [facility] for [employer], [facility]

## 2020-07-24 NOTE — H&P (Signed)
Preoperative History and Physical  Robyn Miller is a 24 y.o. G3P1011 here for surgical management of early intrauterine fetal demise, declined medical management.  8 week size IUGS measuring 30 mm seen, no yolk sac or fetal pole.  Mild bleeding currently, no pain reported.   No significant preoperative concerns.  Proposed surgery: Dilation and Evacuation  Past Medical History:  Diagnosis Date  . Abscess   . Anemia   . Gestational hypertension   . Herpes   . Scoliosis    Past Surgical History:  Procedure Laterality Date  . WISDOM TOOTH EXTRACTION     OB History  Gravida Para Term Preterm AB Living  3 1 1   1 1   SAB TAB Ectopic Multiple Live Births  1     0 1    # Outcome Date GA Lbr Len/2nd Weight Sex Delivery Anes PTL Lv  3 Current           2 Term 05/04/19 [redacted]w[redacted]d 06:08 / 00:34 2855 g F Vag-Spont EPI  LIV     Birth Comments: WDL  1 SAB 2016 [redacted]w[redacted]d         Patient denies any other pertinent gynecologic issues.   No current facility-administered medications on file prior to encounter.   No current outpatient medications on file prior to encounter.   No Known Allergies  Social History:   reports that she has never smoked. She has never used smokeless tobacco. She reports previous alcohol use. She reports previous drug use. Drug: Marijuana.  Family History  Problem Relation Age of Onset  . Scoliosis Mother   . Hypertension Maternal Grandmother     Review of Systems: Pertinent items noted in HPI and remainder of comprehensive ROS otherwise negative.  PHYSICAL EXAM: Blood pressure 128/75, pulse 81, temperature 98.4 F (36.9 C), temperature source Oral, resp. rate 16, height 5\' 8"  (1.727 m), weight 77 kg, SpO2 99 %, unknown if currently breastfeeding. CONSTITUTIONAL: Well-developed, well-nourished female in no acute distress.  HENT:  Normocephalic, atraumatic, External right and left ear normal.  EYES: Conjunctivae and EOM are normal. Pupils are equal, round, and reactive  to light. No scleral icterus.  NECK: Normal range of motion, supple, no masses SKIN: Skin is warm and dry. No rash noted. Not diaphoretic. No erythema. No pallor. NEUROLOGIC: Alert and oriented to person, place, and time. Normal reflexes, muscle tone coordination. No cranial nerve deficit noted. PSYCHIATRIC: Normal mood and affect. Normal behavior. Normal judgment and thought content. CARDIOVASCULAR: Normal heart rate noted, regular rhythm RESPIRATORY: Effort and breath sounds normal, no problems with respiration noted ABDOMEN: Soft, nontender, nondistended. PELVIC: Deferred MUSCULOSKELETAL: Normal range of motion. No edema and no tenderness. 2+ distal pulses.  Labs: Results for orders placed or performed during the hospital encounter of 07/23/20 (from the past 336 hour(s))  SARS CORONAVIRUS 2 (TAT 6-24 HRS) Nasopharyngeal Nasopharyngeal Swab   Collection Time: 07/23/20  9:23 AM   Specimen: Nasopharyngeal Swab  Result Value Ref Range   SARS Coronavirus 2 NEGATIVE NEGATIVE  Results for orders placed or performed during the hospital encounter of 07/19/20 (from the past 336 hour(s))  CBC   Collection Time: 07/19/20  9:33 AM  Result Value Ref Range   WBC 6.5 4.0 - 10.5 K/uL   RBC 4.62 3.87 - 5.11 MIL/uL   Hemoglobin 11.6 (L) 12.0 - 15.0 g/dL   HCT 07/21/20 36 - 46 %   MCV 80.1 80.0 - 100.0 fL   MCH 25.1 (L) 26.0 - 34.0  pg   MCHC 31.4 30.0 - 36.0 g/dL   RDW 38.1 (H) 82.9 - 93.7 %   Platelets 307 150 - 400 K/uL   nRBC 0.0 0.0 - 0.2 %  Basic metabolic panel   Collection Time: 07/19/20  9:33 AM  Result Value Ref Range   Sodium 136 135 - 145 mmol/L   Potassium 3.2 (L) 3.5 - 5.1 mmol/L   Chloride 103 98 - 111 mmol/L   CO2 23 22 - 32 mmol/L   Glucose, Bld 93 70 - 99 mg/dL   BUN 6 6 - 20 mg/dL   Creatinine, Ser 1.69 0.44 - 1.00 mg/dL   Calcium 9.2 8.9 - 67.8 mg/dL   GFR, Estimated >93 >81 mL/min   Anion gap 10 5 - 15  hCG, quantitative, pregnancy   Collection Time: 07/19/20  9:33 AM   Result Value Ref Range   hCG, Beta Chain, Quant, S 10,292 (H) <5 mIU/mL  Wet prep, genital   Collection Time: 07/19/20 10:27 AM  Result Value Ref Range   Yeast Wet Prep HPF POC NONE SEEN NONE SEEN   Trich, Wet Prep NONE SEEN NONE SEEN   Clue Cells Wet Prep HPF POC MODERATE (A) NONE SEEN   WBC, Wet Prep HPF POC MANY (A) NONE SEEN   Sperm NONE SEEN   Results for orders placed or performed in visit on 07/13/20 (from the past 336 hour(s))  Pregnancy, urine POC   Collection Time: 07/13/20  9:40 AM  Result Value Ref Range   Preg Test, Ur POSITIVE (A) NEGATIVE    Imaging Studies: US OB Limited > 14 wks  Result Date: 07/19/2020 CLINICAL DATA:  Pregnant patient with bleeding. EXAM: OBSTETRIC <14 WK [place] AND TRANSVAGINAL OB US TECHNIQUE: Both transabdominal and transvaginal ultrasound examinations were performed for complete evaluation of the gestation as well as the maternal uterus, adnexal regions, and pelvic cul-de-sac. Transvaginal technique was performed to assess early pregnancy. COMPARISON:  None. FINDINGS: Intrauterine gestational sac: There is a thin walled fluid-filled structure within the endometrium, likely a gestational sac. Yolk sac:  A normal yolk sac is not seen. Embryo:  A normal fetal pole is not seen. MSD: 30 mm   8 w   1 d CRL:    mm    w    d                  [place] EDC: Subchorionic hemorrhage:  None visualized. Maternal uterus/adnexae: The uterus measures 9.2 x 6.6 x 6.7 cm with a volume of 220 mm. The ovaries are normal in appearance. No free fluid in the pelvis. IMPRESSION: 1. A thin walled fluid collection is seen in the endometrium, likely a gestational sac. However, neither a normal yolk sac nor a normal fetal pole are identified. There are 2 echogenic structures within the apparent gestational sac with the largest measuring up to 18 mm. Neither of these structures appear to represent a yolk sac or fetal pole. Given a mean sac diameter of greater than 25 mm, the findings are  consistent with a failed pregnancy. Findings meet definitive criteria for failed pregnancy. This follows SRU consensus guidelines: Diagnostic Criteria for Nonviable Pregnancy Early in the First Trimester. [facility] Mis J Med [phone]. Electronically Signed   By: Gerome Sam III M.D   On: 07/19/2020 13:11    Assessment: Principal Problem:   Missed abortion Active Problems:   Bacterial vaginitis  Plan: Patient will undergo surgical management with  Dilation and Evacuation.  The risks of surgery were discussed in detail with the patient including but not limited to: bleeding, infection, injury to surrounding organs, need for additional procedures, possibility of intrauterine scarring which may impair future fertility, risk of retained products which may require further management and other postoperative/anesthesia complications were explained to patient.  Likelihood of success of complete evacuation of the uterus was discussed with the patient.  Written informed consent was obtained.  Patient has been NPO since last night  she will remain NPO for procedure. Anesthesia and OR aware.  Preoperative prophylactic Doxycycline 200mg  IV  has been ordered and is on call to the OR.  To OR when ready.   Of note, patient noted to have BV on recent wet prep, she will be given Metronidazole 500 mg po bid x 7 days at discharge to treat this.  Patient informed.    , MD, FACOG Obstetrician & Gynecologist, [facility] for [facility]

## 2020-07-27 ENCOUNTER — Encounter (HOSPITAL_BASED_OUTPATIENT_CLINIC_OR_DEPARTMENT_OTHER): Payer: Self-pay | Admitting: Obstetrics & Gynecology

## 2020-07-27 NOTE — BH Specialist Note (Deleted)
Integrated Behavioral Health via Telemedicine Video (Caregility) Visit  07/27/2020 Robyn Miller [id number]  Number of Integrated Behavioral Health visits: 1 Session Start time: 8:15***  Session End time: 9:15*** Total time: {IBH Total Time:21014050} minutes  Referring Provider: Donia Ast, NP Type of Service: Individual, Family, *** Patient/Family location: Home [facility] Provider location: Center for [employer] at [facility]  All persons participating in visit: Patient *** and [facility] Makaio Mach ***  [place]    I connected with [name] and/or Bernadette Hoit D Colarusso's {family members:20773} by a video enabled telemedicine application (Caregility) and verified that I am speaking with the correct person using two identifiers.   Discussed confidentiality: {YES/NO:21197}  Confirmed demographics & insurance:  {YES/NO:21197}  I discussed that engaging in this virtual visit, they consent to the provision of behavioral healthcare and the services will be billed under their insurance.   Patient and/or legal guardian expressed understanding and consented to virtual visit: {YES/NO:21197}  PRESENTING CONCERNS: Patient and/or family reports the following symptoms/concerns: *** Duration of problem: ***; Severity of problem: {Mild/Moderate/Severe:20260}  STRENGTHS (Protective Factors/Coping Skills): {CHL AMB BH PROTECTIVE FACTORS/[phone]}  ASSESSMENT: Patient currently experiencing ***.    GOALS ADDRESSED: Patient will: 1.  Reduce symptoms of: {IBH Symptoms:21014056}  2.  Increase knowledge and/or ability of: {IBH Patient Tools:21014057}  3.  Demonstrate ability to: {IBH Goals:21014053}   Progress of Goals: {CHL AMB BH PROGRESS TOWARDS FBXUX:[id number]}  INTERVENTIONS: Interventions utilized:  {IBH Interventions:21014054} Standardized Assessments completed & reviewed: {IBH Screening Tools:21014051}   OUTCOME: Patient Response:  ***   PLAN: 1. Follow up with behavioral health clinician on : *** 2. Behavioral recommendations: *** 3. Referral(s): {IBH Referrals:21014055}  I discussed the assessment and treatment plan with the patient and/or parent/guardian. They were provided an opportunity to ask questions and all were answered. They agreed with the plan and demonstrated an understanding of the instructions.   They were advised to call back or seek an in-person evaluation as appropriate.  I discussed that the purpose of this visit is to provide behavioral health care while limiting exposure to the novel coronavirus.  Discussed there is a possibility of technology failure and discussed alternative modes of communication if that failure occurs.  Valetta Close Rylee Nuzum  ***

## 2020-07-28 LAB — SURGICAL PATHOLOGY

## 2020-08-07 ENCOUNTER — Encounter: Payer: Medicaid Other | Admitting: Obstetrics and Gynecology

## 2020-08-10 NOTE — BH Specialist Note (Signed)
Integrated Behavioral Health via Telemedicine Video (Caregility) Visit  08/10/2020 Robyn Miller [id number]  Number of Integrated Behavioral Health visits: 1 Session Start time: 8:16  Session End time: 8:34 Total time: 18 minutes  Referring Provider: Donia Ast, NP Type of Service: Individual Patient/Family [facility] Provider location: [facility]  All persons participating in visit: Patient Robyn Miller and Robyn Miller Robyn Miller     I connected with Robyn Miller by a video enabled telemedicine application (Caregility) and verified that I am speaking with the correct person using two identifiers.   Discussed confidentiality: Yes   Confirmed demographics & insurance:  Yes   I discussed that engaging in this virtual visit, they consent to the provision of behavioral healthcare and the services will be billed under their insurance.   Patient and/or legal guardian expressed understanding and consented to virtual visit: Yes   PRESENTING CONCERNS: Patient and/or family reports the following symptoms/concerns: Pt states she is feeling much better than immediately after her loss; is experiencing fatigue that she attributes to 1yo daughter's restless sleep; pt is most concerned today with pain in arm (swollen at site of IV) that she is using heating pad and menthol wraps to help somewhat.  Duration of problem: over two weeks; Severity of problem: mild  STRENGTHS (Protective Factors/Coping Skills): Good social support  ASSESSMENT: Patient was experiencing Grief immediately after loss.    GOALS ADDRESSED:  1.  Demonstrate ability to: Continue healthy grieving    Progress of Goals: Achieved  INTERVENTIONS: Interventions utilized:  Supportive Counseling and Psychoeducation and/or Health Education Standardized Assessments completed & reviewed: GAD-7 and PHQ 9   OUTCOME: Patient Response: Pt agrees to treatment  planb   PLAN: 1. Follow up with behavioral health clinician on : As needed 2. Behavioral recommendations:  -Continue to allow feelings of grief, as they come up -Continue with plan to talk to daughter's pediatrician about sleep issue -If pain in arm continues, discuss with Robyn Miller at appointment on 08/21/20 3. Referral(s): Integrated [employer] (In Clinic)  I discussed the assessment and treatment plan with the patient and/or parent/guardian. They were provided an opportunity to ask questions and all were answered. They agreed with the plan and demonstrated an understanding of the instructions.   They were advised to call back or seek an in-person evaluation as appropriate.  I discussed that the purpose of this visit is to provide behavioral health care while limiting exposure to the novel coronavirus.  Discussed there is a possibility of technology failure and discussed alternative modes of communication if that failure occurs.  Robyn Miller Robyn Miller   Depression screen [facility] 2/9 08/11/2020 12/19/2018 11/21/2018 10/23/2018  Decreased Interest 0 0 0 0  Down, Depressed, Hopeless 0 0 0 0  PHQ - 2 Score 0 0 0 0  Altered sleeping 0 0 0 0  Tired, decreased energy 2 1 2  0  Change in appetite 0 0 0 3  Feeling bad or failure about yourself  0 0 0 0  Trouble concentrating 0 0 0 0  Moving slowly or fidgety/restless 0 0 0 0  Suicidal thoughts 0 0 0 0  PHQ-9 Score 2 1 2 3    GAD 7 : Generalized Anxiety Score 08/11/2020 12/19/2018 11/21/2018 10/23/2018  Nervous, Anxious, on Edge 0 0 0 0  Control/stop worrying 0 1 1 0  Worry too much - different things 0 1 1 1   Trouble relaxing 0 0 0  0  Restless 0 0 0 0  Easily annoyed or irritable - 0 0 0  Afraid - awful might happen 0 0 0 0  Total GAD 7 Score - 2 2 1

## 2020-08-11 ENCOUNTER — Ambulatory Visit (INDEPENDENT_AMBULATORY_CARE_PROVIDER_SITE_OTHER): Payer: Medicaid Other | Admitting: Clinical

## 2020-08-11 ENCOUNTER — Other Ambulatory Visit: Payer: Self-pay

## 2020-08-11 DIAGNOSIS — F4321 Adjustment disorder with depressed mood: Secondary | ICD-10-CM | POA: Diagnosis not present

## 2020-08-11 NOTE — Patient Instructions (Signed)
[facility] at [facility] [address] [place], [place] [address] [phone] (main office) [phone] ([place]'s office)  /Emotional Wellbeing Apps and Websites Here are a few free apps meant to help you to help yourself.  To find, try searching on the internet to see if the app is offered on Apple/Android devices. If your first choice doesn't come up on your device, the good news is that there are many choices! Play around with different apps to see which ones are helpful to you.    Calm This is an app meant to help increase calm feelings. Includes info, strategies, and tools for tracking your feelings.      Calm Harm  This app is meant to help with self-harm. Provides many 5-minute or 15-min coping strategies for doing instead of hurting yourself.       Healthy Minds Health Minds is a problem-solving tool to help deal with emotions and cope with stress you encounter wherever you are.      MindShift This app can help people cope with anxiety. Rather than trying to avoid anxiety, you can make an important shift and face it.      MY3  MY3 features a support system, safety plan and resources with the goal of offering a tool to use in a time of need.       My Life My Voice  This mood journal offers a simple solution for tracking your thoughts, feelings and moods. Animated emoticons can help identify your mood.       Relax Melodies Designed to help with sleep, on this app you can mix sounds and meditations for relaxation.      Smiling Mind Smiling Mind is meditation made easy: it's a simple tool that helps put a smile on your mind.        Stop, Breathe & Think  A friendly, simple guide for people through meditations for mindfulness and compassion.  Stop, Breathe and Think Kids Enter your current feelings and choose a "mission" to help you cope. Offers videos for certain moods instead of just sound recordings.       Team  Orange The goal of this tool is to help teens change how they think, act, and react. This app helps you focus on your own good feelings and experiences.      The Virtual Hope Box The Virtual Hope Box (VHB) contains simple tools to help patients with coping, relaxation, distraction, and positive thinking.     

## 2020-08-21 ENCOUNTER — Ambulatory Visit: Payer: Medicaid Other | Admitting: Obstetrics and Gynecology

## 2021-06-30 ENCOUNTER — Emergency Department (HOSPITAL_COMMUNITY): Payer: Medicaid Other

## 2021-06-30 ENCOUNTER — Emergency Department (HOSPITAL_COMMUNITY)
Admission: EM | Admit: 2021-06-30 | Discharge: 2021-06-30 | Disposition: A | Discharge status: Home / Self Care | Payer: Medicaid Other | Attending: Emergency Medicine | Admitting: Emergency Medicine

## 2021-06-30 ENCOUNTER — Other Ambulatory Visit: Payer: Self-pay

## 2021-06-30 ENCOUNTER — Encounter (HOSPITAL_COMMUNITY): Payer: Self-pay | Admitting: *Deleted

## 2021-06-30 DIAGNOSIS — T1490XA Injury, unspecified, initial encounter: Secondary | ICD-10-CM

## 2021-06-30 DIAGNOSIS — R188 Other ascites: Secondary | ICD-10-CM | POA: Diagnosis not present

## 2021-06-30 DIAGNOSIS — M545 Low back pain, unspecified: Secondary | ICD-10-CM | POA: Insufficient documentation

## 2021-06-30 DIAGNOSIS — M79641 Pain in right hand: Secondary | ICD-10-CM | POA: Diagnosis not present

## 2021-06-30 DIAGNOSIS — Z87891 Personal history of nicotine dependence: Secondary | ICD-10-CM | POA: Insufficient documentation

## 2021-06-30 DIAGNOSIS — K429 Umbilical hernia without obstruction or gangrene: Secondary | ICD-10-CM | POA: Diagnosis not present

## 2021-06-30 DIAGNOSIS — M542 Cervicalgia: Secondary | ICD-10-CM | POA: Diagnosis not present

## 2021-06-30 DIAGNOSIS — N9489 Other specified conditions associated with female genital organs and menstrual cycle: Secondary | ICD-10-CM | POA: Diagnosis not present

## 2021-06-30 DIAGNOSIS — R079 Chest pain, unspecified: Secondary | ICD-10-CM | POA: Insufficient documentation

## 2021-06-30 DIAGNOSIS — R519 Headache, unspecified: Secondary | ICD-10-CM | POA: Insufficient documentation

## 2021-06-30 DIAGNOSIS — M7918 Myalgia, other site: Secondary | ICD-10-CM

## 2021-06-30 DIAGNOSIS — R609 Edema, unspecified: Secondary | ICD-10-CM | POA: Diagnosis not present

## 2021-06-30 DIAGNOSIS — Y92410 Unspecified street and highway as the place of occurrence of the external cause: Secondary | ICD-10-CM | POA: Insufficient documentation

## 2021-06-30 DIAGNOSIS — S0990XA Unspecified injury of head, initial encounter: Secondary | ICD-10-CM | POA: Diagnosis not present

## 2021-06-30 DIAGNOSIS — R1030 Lower abdominal pain, unspecified: Secondary | ICD-10-CM | POA: Diagnosis not present

## 2021-06-30 DIAGNOSIS — I10 Essential (primary) hypertension: Secondary | ICD-10-CM | POA: Insufficient documentation

## 2021-06-30 DIAGNOSIS — M25561 Pain in right knee: Secondary | ICD-10-CM | POA: Diagnosis not present

## 2021-06-30 DIAGNOSIS — M25562 Pain in left knee: Secondary | ICD-10-CM | POA: Diagnosis not present

## 2021-06-30 DIAGNOSIS — Z041 Encounter for examination and observation following transport accident: Secondary | ICD-10-CM | POA: Diagnosis not present

## 2021-06-30 LAB — CBC WITH DIFFERENTIAL/PLATELET
Abs Immature Granulocytes: 0.02 10*3/uL (ref 0.00–0.07)
Basophils Absolute: 0 10*3/uL (ref 0.0–0.1)
Basophils Relative: 1 %
Eosinophils Absolute: 0.1 10*3/uL (ref 0.0–0.5)
Eosinophils Relative: 1 %
HCT: 37.8 % (ref 36.0–46.0)
Hemoglobin: 12.2 g/dL (ref 12.0–15.0)
Immature Granulocytes: 0 %
Lymphocytes Relative: 30 %
Lymphs Abs: 2.1 10*3/uL (ref 0.7–4.0)
MCH: 27.7 pg (ref 26.0–34.0)
MCHC: 32.3 g/dL (ref 30.0–36.0)
MCV: 85.9 fL (ref 80.0–100.0)
Monocytes Absolute: 0.3 10*3/uL (ref 0.1–1.0)
Monocytes Relative: 5 %
Neutro Abs: 4.5 10*3/uL (ref 1.7–7.7)
Neutrophils Relative %: 63 %
Platelets: 265 10*3/uL (ref 150–400)
RBC: 4.4 MIL/uL (ref 3.87–5.11)
RDW: 13.5 % (ref 11.5–15.5)
WBC: 7 10*3/uL (ref 4.0–10.5)
nRBC: 0 % (ref 0.0–0.2)

## 2021-06-30 LAB — COMPREHENSIVE METABOLIC PANEL
ALT: 12 U/L (ref 0–44)
AST: 22 U/L (ref 15–41)
Albumin: 4.8 g/dL (ref 3.5–5.0)
Alkaline Phosphatase: 76 U/L (ref 38–126)
Anion gap: 8 (ref 5–15)
BUN: 7 mg/dL (ref 6–20)
CO2: 28 mmol/L (ref 22–32)
Calcium: 9.7 mg/dL (ref 8.9–10.3)
Chloride: 109 mmol/L (ref 98–111)
Creatinine, Ser: 0.73 mg/dL (ref 0.44–1.00)
GFR, Estimated: >60 mL/min (ref >60)
Glucose, Bld: 105 mg/dL — ABNORMAL HIGH (ref 70–99)
Potassium: 3.1 mmol/L — ABNORMAL LOW (ref 3.5–5.1)
Sodium: 145 mmol/L (ref 135–145)
Total Bilirubin: 0.7 mg/dL (ref 0.3–1.2)
Total Protein: 8.2 g/dL — ABNORMAL HIGH (ref 6.5–8.1)

## 2021-06-30 LAB — I-STAT BETA HCG BLOOD, ED (MC, WL, AP ONLY): I-stat hCG, quantitative: <5 m[IU]/mL (ref <5)

## 2021-06-30 MED ORDER — NAPROXEN 500 MG PO TABS: 500.0000 mg | ORAL_TABLET | Freq: Two times a day (BID) | ORAL | 0 refills | Status: AC

## 2021-06-30 MED ORDER — IOHEXOL 350 MG/ML SOLN
80.0000 mL | Freq: Once | INTRAVENOUS | Status: AC | PRN
  Administered 2021-06-30: 80 mL via INTRAVENOUS

## 2021-06-30 MED ORDER — SODIUM CHLORIDE (PF) 0.9 % IJ SOLN
INTRAMUSCULAR | Status: AC
  Filled 2021-06-30: qty 50

## 2021-06-30 MED ORDER — METHOCARBAMOL 500 MG PO TABS: 500.0000 mg | ORAL_TABLET | Freq: Two times a day (BID) | ORAL | 0 refills | Status: AC

## 2021-06-30 MED ORDER — ACETAMINOPHEN 325 MG PO TABS: 650.0000 mg | ORAL_TABLET | Freq: Once | ORAL | Status: DC
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
## 2021-06-30 NOTE — ED Notes (Signed)
Patient transported to CT 

## 2021-06-30 NOTE — Discharge Instructions (Addendum)

## 2021-06-30 NOTE — ED Notes (Signed)
Pt returned from CT °
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
## 2021-07-01 ENCOUNTER — Telehealth: Payer: Self-pay

## 2021-07-01 DIAGNOSIS — S299XXA Unspecified injury of thorax, initial encounter: Secondary | ICD-10-CM | POA: Diagnosis not present

## 2021-07-01 DIAGNOSIS — R0789 Other chest pain: Secondary | ICD-10-CM | POA: Diagnosis not present

## 2021-07-01 DIAGNOSIS — Z041 Encounter for examination and observation following transport accident: Secondary | ICD-10-CM | POA: Diagnosis not present

## 2021-07-01 NOTE — Telephone Encounter (Signed)
Transition Care Management Unsuccessful Follow-up Telephone Call  Date of discharge and from where:  06/30/2021-[facility]   Attempts:  1st Attempt  Reason for unsuccessful TCM follow-up call:  Left voice message

## 2021-07-05 NOTE — Telephone Encounter (Signed)
Transition Care Management Follow-up Telephone Call Date of discharge and from where: 07/01/2021 from [facility] How have you been since you were released from the hospital? Pt states that she is still sore and has painful bruises.  Any questions or concerns? No  Items Reviewed: Did the pt receive and understand the discharge instructions provided? Yes  Medications obtained and verified? Yes  Other? No  Any new allergies since your discharge? No  Dietary orders reviewed? No Do you have support at home? Yes   Functional Questionnaire: (I = Independent and D = Dependent) ADLs: I  Bathing/Dressing- I  Meal Prep- I  Eating- I  Maintaining continence- I  Transferring/Ambulation- I  Managing Meds- I   Follow up appointments reviewed:  PCP Hospital f/u appt confirmed? No   Specialist Hospital f/u appt confirmed? No   Are transportation arrangements needed? No  If their condition worsens, is the pt aware to call PCP or go to the Emergency Dept.? Yes Was the patient provided with contact information for the PCP's office or ED? Yes Was to pt encouraged to call back with questions or concerns? Yes
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
